# Patient Record
Sex: Female | Born: 1935 | Race: White | Hispanic: No | Marital: Married | Smoking: Never smoker
Health system: Southern US, Community
[De-identification: ages and names within clinical notes are randomized; demographics above are authoritative.]

## PROBLEM LIST (undated history)

## (undated) DIAGNOSIS — Z923 Personal history of irradiation: Secondary | ICD-10-CM

## (undated) DIAGNOSIS — C50919 Malignant neoplasm of unspecified site of unspecified female breast: Secondary | ICD-10-CM

## (undated) DIAGNOSIS — E119 Type 2 diabetes mellitus without complications: Secondary | ICD-10-CM

## (undated) DIAGNOSIS — I10 Essential (primary) hypertension: Secondary | ICD-10-CM

## (undated) HISTORY — PX: BREAST LUMPECTOMY: SHX2

## (undated) HISTORY — DX: Malignant neoplasm of unspecified site of unspecified female breast: C50.919

## (undated) HISTORY — DX: Essential (primary) hypertension: I10

## (undated) HISTORY — PX: CHOLECYSTECTOMY: SHX55

## (undated) HISTORY — PX: TONSILLECTOMY: SUR1361

## (undated) HISTORY — DX: Type 2 diabetes mellitus without complications: E11.9

---

## 1987-05-15 DIAGNOSIS — C50919 Malignant neoplasm of unspecified site of unspecified female breast: Secondary | ICD-10-CM

## 1987-05-15 HISTORY — DX: Malignant neoplasm of unspecified site of unspecified female breast: C50.919

## 1997-12-03 ENCOUNTER — Other Ambulatory Visit: Admission: RE | Admit: 1997-12-03 | Discharge: 1997-12-03 | Payer: Self-pay | Admitting: *Deleted

## 2001-03-11 ENCOUNTER — Encounter: Admission: RE | Admit: 2001-03-11 | Discharge: 2001-03-11 | Payer: Self-pay | Admitting: Oncology

## 2001-03-11 ENCOUNTER — Encounter (HOSPITAL_COMMUNITY): Admission: RE | Admit: 2001-03-11 | Discharge: 2001-04-10 | Payer: Self-pay | Admitting: Oncology

## 2001-03-11 ENCOUNTER — Encounter (HOSPITAL_COMMUNITY): Payer: Self-pay | Admitting: Oncology

## 2001-03-18 ENCOUNTER — Encounter (HOSPITAL_COMMUNITY): Payer: Self-pay | Admitting: Oncology

## 2001-04-08 ENCOUNTER — Encounter (HOSPITAL_COMMUNITY): Payer: Self-pay | Admitting: Oncology

## 2001-04-18 ENCOUNTER — Other Ambulatory Visit: Admission: RE | Admit: 2001-04-18 | Discharge: 2001-04-18 | Payer: Self-pay | Admitting: *Deleted

## 2001-06-20 ENCOUNTER — Ambulatory Visit (HOSPITAL_COMMUNITY): Admission: RE | Admit: 2001-06-20 | Discharge: 2001-06-20 | Payer: Self-pay | Admitting: Internal Medicine

## 2002-03-05 ENCOUNTER — Ambulatory Visit (HOSPITAL_COMMUNITY): Admission: RE | Admit: 2002-03-05 | Discharge: 2002-03-05 | Payer: Self-pay | Admitting: Internal Medicine

## 2002-03-12 ENCOUNTER — Ambulatory Visit (HOSPITAL_COMMUNITY): Admission: RE | Admit: 2002-03-12 | Discharge: 2002-03-12 | Payer: Self-pay | Admitting: Oncology

## 2002-03-12 ENCOUNTER — Encounter (HOSPITAL_COMMUNITY): Payer: Self-pay | Admitting: Oncology

## 2002-04-07 ENCOUNTER — Encounter (HOSPITAL_COMMUNITY): Admission: RE | Admit: 2002-04-07 | Discharge: 2002-05-07 | Payer: Self-pay | Admitting: Oncology

## 2002-04-07 ENCOUNTER — Encounter: Admission: RE | Admit: 2002-04-07 | Discharge: 2002-04-07 | Payer: Self-pay | Admitting: Oncology

## 2002-10-01 ENCOUNTER — Other Ambulatory Visit: Admission: RE | Admit: 2002-10-01 | Discharge: 2002-10-01 | Payer: Self-pay | Admitting: *Deleted

## 2003-05-25 ENCOUNTER — Ambulatory Visit (HOSPITAL_COMMUNITY): Admission: RE | Admit: 2003-05-25 | Discharge: 2003-05-25 | Payer: Self-pay | Admitting: *Deleted

## 2004-03-28 ENCOUNTER — Ambulatory Visit (HOSPITAL_COMMUNITY): Admission: RE | Admit: 2004-03-28 | Discharge: 2004-03-28 | Payer: Self-pay | Admitting: *Deleted

## 2004-07-18 ENCOUNTER — Ambulatory Visit (HOSPITAL_COMMUNITY): Admission: RE | Admit: 2004-07-18 | Discharge: 2004-07-18 | Payer: Self-pay | Admitting: *Deleted

## 2005-05-02 ENCOUNTER — Other Ambulatory Visit: Admission: RE | Admit: 2005-05-02 | Discharge: 2005-05-02 | Payer: Self-pay | Admitting: Dermatology

## 2005-07-03 ENCOUNTER — Ambulatory Visit: Payer: Self-pay | Admitting: Internal Medicine

## 2006-05-15 ENCOUNTER — Ambulatory Visit (HOSPITAL_COMMUNITY): Admission: RE | Admit: 2006-05-15 | Discharge: 2006-05-15 | Payer: Self-pay | Admitting: Family Medicine

## 2007-07-15 ENCOUNTER — Ambulatory Visit: Payer: Self-pay | Admitting: Gastroenterology

## 2007-07-25 ENCOUNTER — Encounter: Payer: Self-pay | Admitting: Internal Medicine

## 2007-07-25 ENCOUNTER — Ambulatory Visit (HOSPITAL_COMMUNITY): Admission: RE | Admit: 2007-07-25 | Discharge: 2007-07-25 | Payer: Self-pay | Admitting: Internal Medicine

## 2007-07-25 ENCOUNTER — Ambulatory Visit: Payer: Self-pay | Admitting: Internal Medicine

## 2008-01-16 ENCOUNTER — Encounter: Admission: RE | Admit: 2008-01-16 | Discharge: 2008-01-16 | Payer: Self-pay | Admitting: Family Medicine

## 2008-09-30 ENCOUNTER — Encounter (INDEPENDENT_AMBULATORY_CARE_PROVIDER_SITE_OTHER): Payer: Self-pay | Admitting: *Deleted

## 2008-11-12 ENCOUNTER — Ambulatory Visit (HOSPITAL_COMMUNITY): Admission: RE | Admit: 2008-11-12 | Discharge: 2008-11-12 | Payer: Self-pay | Admitting: Cardiology

## 2008-11-17 ENCOUNTER — Ambulatory Visit (HOSPITAL_COMMUNITY): Admission: RE | Admit: 2008-11-17 | Discharge: 2008-11-17 | Payer: Self-pay | Admitting: Cardiology

## 2010-08-20 LAB — CBC
HCT: 35.2 % — ABNORMAL LOW (ref 36.0–46.0)
Hemoglobin: 11.8 g/dL — ABNORMAL LOW (ref 12.0–15.0)
MCHC: 33.6 g/dL (ref 30.0–36.0)
Platelets: 195 10*3/uL (ref 150–400)
RBC: 4.27 MIL/uL (ref 3.87–5.11)
RDW: 16.7 % — ABNORMAL HIGH (ref 11.5–15.5)
WBC: 6.9 10*3/uL (ref 4.0–10.5)

## 2010-08-20 LAB — BASIC METABOLIC PANEL
BUN: 9 mg/dL (ref 6–23)
Chloride: 110 mEq/L (ref 96–112)
Creatinine, Ser: 0.76 mg/dL (ref 0.4–1.2)
GFR calc non Af Amer: >60 mL/min (ref >60)
Glucose, Bld: 107 mg/dL — ABNORMAL HIGH (ref 70–99)
Sodium: 142 mEq/L (ref 135–145)

## 2010-09-26 NOTE — Cardiovascular Report (Signed)
NAMEFREEDOM, LOPEZPEREZ NO.:  [id number]   MEDICAL RECORD NO.:  [id number]          PATIENT TYPE:  OIB   LOCATION:  2899                         FACILITY:  [facility]   PHYSICIAN:  Sheliah Mends, MD      DATE OF BIRTH:  09/10/1935   DATE OF PROCEDURE:  11/17/2008  DATE OF DISCHARGE:  11/17/2008                            CARDIAC CATHETERIZATION   INDICATION:  Ms. Hollman is a 75 year old lady who presented with chest  pain and an abnormal myocardial perfusion scan.  She has multiple risk  factors for coronary artery disease and a decision was made to proceed  with cardiac catheterization.   PROCEDURES:  1. Left heart catheterization.  2. Selective coronary angiography.  3. Left ventriculography.   After informed consent was obtained, the patient was brought to the  [facility] Cardiac Catheterization Lab in postabsorptive state.  She was draped in sterile fashion.  Local anesthesia was applied using  lidocaine 1%.  She was started on conscious sedation with Versed and  fentanyl.  A 5-French femoral artery sheath was inserted using the  modified Seldinger technique.  Left heart catheterization was performed  using a 5-French #4 left and right Judkins catheter and left  ventriculography was completed using a 5-French pigtail catheter.   FINDINGS:  Hemodynamics:  Left ventricular pressure 156/10 mmHg, aortic  pressure 152/73 mmHg.   SELECTIVE CORONARY ANGIOGRAPHY:  Left main:  The left main is a very  short vessel that bifurcates almost immediately into the LAD and left  circumflex artery.  The left main has no angiographically significant  disease.   Left anterior descending artery:  The left anterior descending artery is  a large vessel that gives off two diagonal arteries.  The LAD wraps  around the apex.  There was no angiographically significant lesion seen  in the LAD territory.   Left circumflex artery:  The left circumflex is a large vessel that  gives off a branching obtuse marginal.  There is no angiographically  significant disease in the left circumflex artery seen.   Right coronary artery:  The right coronary artery is a medium-sized  vessel that gives off PDA.  The RCA is a dominant vessel.  There is no  significant plaque buildup seen in the RCA territory.   LEFT VENTRICULOGRAPHY:  Left ventriculogram shows normal systolic  function with an estimated ejection fraction of 60%.  There is no  significant mitral regurgitation seen.   SUMMARY:  1. No hemodynamically significant coronary artery disease.  2. Normal left ventricular function.  3. No significant valve disease noted.   PLAN:  Ms. Parada will be continued on medical therapy.  She had  markedly elevated blood pressures throughout the procedure and the focus  will be on blood pressure control.   The procedure was completed without complications.      Sheliah Mends, MD  Electronically Signed     JE/MEDQ  D:  11/17/2008  T:  11/18/2008  Job:  [id number]

## 2010-09-26 NOTE — H&P (Signed)
NAME:  Ashley Huang, Ashley Huang                 ACCOUNT NO.:  [id number]   MEDICAL RECORD NO.:  [id number]          PATIENT TYPE:  AMB   LOCATION:  DAY                           FACILITY:  [facility]   PHYSICIAN:  Kassie Mends, M.D.      DATE OF BIRTH:  [birth year]   DATE OF ADMISSION:  DATE OF DISCHARGE:  LH                              HISTORY & PHYSICAL   CHIEF COMPLAINT:  Refractory gastroesophageal reflux disease/due for  high-risk screening colonoscopy.   HISTORY OF PRESENT ILLNESS:  Ashley Huang is a 75 year old female.  She  has history of chronic GERD.  She has been taking Prevacid 30 mg daily.  She tells me it is not working well for the last 3 months or so.  She is  having nightly regurgitation as well as waterbrash, some heartburn and  indigestion about 3 nights per week.  She denies any anorexia or early  satiety.  Denies any dysphagia or odynophagia.  Denies any fatigue.  She  does admit to taking Metamucil about 30 minutes prior to bedtime.  She  gives history of normal bowel movements once a day.  Denies rectal  bleeding or melena.  She occasionally has a transient left lower  quadrant and right lower quadrant abdominal pain, but otherwise is doing  quite well.   PAST MEDICAL/SURGICAL HISTORY:  She had breast cancer 20 years ago  status post lumpectomy.  She has history of chronic GERD.  Last EGD was  by Dr. Jena Gauss on March 05, 2002 which was normal.  She was on Aciphex  at that time.  She is status post partial hysterectomy, cholecystectomy,  tonsillectomy.  Last colonoscopy was by Dr. Karilyn Cota on June 20, 2001.  She was found to have a few medium scattered diverticula in the sigmoid  colon and small external hemorrhoids.   CURRENT MEDICATIONS:  1. Prevacid 30 mg daily.  2. Metamucil daily.  3. Calcium 500 mg with vitamin D daily.   ALLERGIES:  1. SULFA.  2. SELDANE.  3. RED DYE.   FAMILY HISTORY:  There is no family history of colon carcinoma or other  chronic GI problems.   Mother deceased at 58 secondary to diabetes  mellitus and renal failure.  Father deceased at 24 secondary to prostate  carcinoma.  She has 1 brother with history of diabetes mellitus and 1  with hypertension and MI.  One healthy sister.   SOCIAL HISTORY:  Ashley Huang is married.  She has 2 grown healthy  children.  She is semi-retired from a dental office.  She denies any  tobacco, alcohol, drug use.   REVIEW OF SYSTEMS:  See HPI, otherwise negative.   PHYSICAL EXAMINATION:  VITAL SIGNS:  Weight 173 pounds, height 60  inches, temperature 97.8, blood pressure 138/88 and pulse 80.  GENERAL:  She is a well-developed, well-nourished Caucasian female in no  acute distress.  HEENT:  Sclerae are clear and nonicteric.  Conjunctivae  are pink.  Oropharynx pink and moist without any lesions.  NECK: Supple  without mass or thyromegaly.  CHEST/HEART:  Regular rate and  rhythm,  normal S1 and S2 without murmurs, clicks, rubs or gallops.  LUNGS:  Clear to auscultation bilaterally.  ABDOMEN:  Positive bowel sounds x4.  No bruits auscultated.  Soft, nontender and nondistended without  palpable mass or hepatosplenomegaly.  No guarding.  EXTREMITIES:  Without clubbing or edema bilaterally.  SKIN:  Pink, warm, dry without  any rash or jaundice.   IMPRESSION:  Ashley Huang is a 75 year old female with chronic  gastroesophageal reflux disease, now with refractory symptoms despite  PPI, so she needs further evaluation to rule out complicated  gastroesophageal reflux disease/reflux esophagitis.   She is also due for high-risk screening colonoscopy given a family  history of colon cancer and personal history of breast cancer.   PLAN:  1. Would have her change her Metamucil dosing to a.m. instead of just      prior to bed.  2. Continue Prevacid 30 mg daily, but this may need to be either      increased to b.i.d. or changed to another agent after procedures.  3. High-risk screening colonoscopy and diagnostic  EGD with Dr. Jena Gauss      in the near future.  Discussed the procedure including risks,      benefits including but are not limited to bleeding, infection,      perforation, drug reaction.  She agrees with plan and consent will      be obtained.      Lorenza Burton, N.P.      Kassie Mends, M.D.  Electronically Signed    KJ/MEDQ  D:  07/15/2007  T:  07/15/2007  Job:  [id number]   cc:   Corrie Mckusick, M.D.  Fax: [phone]

## 2010-09-26 NOTE — Op Note (Signed)
NAME:  Ashley Huang, Ashley Huang                 ACCOUNT NO.:  [id number]   MEDICAL RECORD NO.:  [id number]          PATIENT TYPE:  AMB   LOCATION:  DAY                           FACILITY:  [facility]   PHYSICIAN:  R. Roetta Sessions, M.D. DATE OF BIRTH:  November 21, 1935   DATE OF PROCEDURE:  07/25/2007  DATE OF DISCHARGE:                               OPERATIVE REPORT   PROCEDURE:  Diagnostic EGD followed by colonoscopy with biopsy.   INDICATIONS FOR PROCEDURE:  A 75 year old lady with chronic  gastroesophageal reflux disease symptoms now refractory to Prevacid 30  mg orally daily.  We tried her on some Zegerid recently through the  office.  This helped to control reflux symptoms better than Prevacid but  produced diarrhea.  She is not having any odynophagia or dysphagia.  She  has positive family history of colon cancer in a first degree relative  (sister) and a personal history of breast cancer.  She is here for high  risk screening colonoscopy as well as a diagnostic EGD.  This approach  has been discussed with the patient at length.  Potential risks,  benefits and limitations have been reviewed.  Please see documentation  in the medical record.   PROCEDURE IN DETAIL:  O2 saturation, blood pressure, pulse, respirations  were monitored throughout the entire procedure.  Conscious sedation of  Versed 4 mg IV, Demerol 50 mg IV in divided doses.  Instrument Pentax  video chip system.  Cetacaine spray for topical oropharyngeal  anesthesia.   EGD findings, examination of the tubular esophagus revealed a couple of  tiny 1-2 mm distal erosions, otherwise the esophageal mucosa appeared  entirely normal.  EG junction easily traversed, stomach, colon, gastric  cavity was emptied and insufflated well with air.  A thorough  examination of the gastric mucosa including retroflexion of the proximal  stomach, esophagogastric junction demonstrated no abnormalities.  Pylorus was patent, easily traversed.  Examination of  the bulb, second  portion revealed no abnormality.  Therapeutic/diagnostic maneuvers  performed none.   The patient tolerated the procedure well.  She was prepared for  colonoscopy.  Digital rectal exam revealed no abnormalities.  The prep  was good.  The colonic mucosa was surveyed from the rectosigmoid  junction through the left transverse and right colon to the appendiceal  orifice, ileocecal valve and cecum.  Terminal ileal was intubated at 5  cm and from this level the scope was slowly withdrawn.  All previously  mentioned mucosal surfaces were again seen.  The patient had a  diminutive polyp opposite the ileocecal valve which was cold biopsied.  The patient had sigmoid diverticula.  The remainder of the colonic  mucosa and terminal ileum mucosa appeared entirely normal.  The scope  was pulled out of the rectum with thorough examination of the rectal  mucosa including retroflexed view of the anal verge demonstrating no  abnormalities.  The patient tolerated both procedures well and was  reactive after endoscopy.   IMPRESSION:  Tiny distal esophageal erosions consistent with mild  erosive reflux esophagitis, otherwise unremarkable esophagus, normal  stomach, D1, D2.  Colonoscopy findings, normal rectum, sigmoid  diverticula, diminutive polyp opposite the ileocecal valve was status  post cold biopsied, remainder of colonic mucosa and terminal ileum  mucosa appeared normal.   RECOMMENDATIONS:  1. Follow up on path.  2. Stop Prevacid/Zegerid and begin Kapidex 60 mg orally daily for the      next month.  Arrange for her to get free samples through the      office.  She is going to obtain them and let us know how she is      doing in 1 month and we will go from there.      Jonathon Bellows, M.D.  Electronically Signed     RMR/MEDQ  D:  07/25/2007  T:  07/26/2007  Job:  [id number]   cc:   Corrie Mckusick, M.D.  Fax: [phone]

## 2010-09-29 NOTE — H&P (Signed)
NAME:  Ashley Huang, Ashley Huang                             ACCOUNT NO.:  [id number]   MEDICAL RECORD NO.:  [id number]                  PATIENT TYPE:   LOCATION:                                       FACILITY:   PHYSICIAN:  R. Roetta Sessions, M.D.              DATE OF BIRTH:  December 20, 1935   DATE OF ADMISSION:  03/02/2002  DATE OF DISCHARGE:                                HISTORY & PHYSICAL   CHIEF COMPLAINT:  Dark stools and abdominal pain.   HISTORY OF PRESENT ILLNESS:  The patient is a pleasant, 75 year old lady who  presents today with a two-week history of abdominal pain, abdominal bloating  and very dark stools.  She called in on February 26, 2002, with complaints.  It was arranged for her to be seen today.  She says that she treated herself  for sinus allergies with over-the-counter a couple of weeks ago.  She noted  abdominal pain with bloating and very dark stools after beginning this  treatment.  The symptoms lasted for about two weeks.  She also had a two-  week episode of heartburn with no prior history of acid reflux.  Eating  seemed to make all of her symptoms worse.  She took some Pepcid AC which  seemed to help.  Her appetite has been good.  She was taking some Excedrin  during this two-week period of time, but no really no NSAIDs or aspirin  prior to this.  She denies any rectal bleeding.  She denies any constipation  or diarrhea.  She tells me for the last couple of days her symptoms have  totally resolved.  Her stool is now much lighter than it was before.  She  brought in three Hemoccult cards today with one being strongly positive.  The patient states that she has lower abdominal discomfort, but really  denied any epigastric pain.   She underwent colonoscopy in February 2003, for heme positive stools and  intermittent hematochezia.  Examination revealed a few median scattered  diverticula in the sigmoid colon and small external hemorrhoids.   MEDICATIONS:  1. Multivitamin  q.d.  2. Calcium 600 plus D t.i.d.  3. Metamucil q.h.s.   ALLERGIES:  RED DYE, SULFA and SELDANE.   PAST MEDICAL HISTORY:  1. Breast cancer 13 years ago, status post radiation and chemotherapy     therapy with lumpectomy and lymph node removal.  2. Cholecystectomy five years ago.  3. Tonsillectomy.  4. Hysterectomy over 40 years ago.   FAMILY HISTORY:  Mother died of kidney failure at age 11.  She also had  diabetes mellitus.  Father died of prostate cancer.  She has one sister who  is 81 who was diagnosed with colon cancer a couple of months ago.  She could  not tolerate chemotherapy and almost died from it.  She did have some  radiation therapy.   SOCIAL HISTORY:  She  has been married x42 years.  She has two children.  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  She has never  been a smoker.  She denies alcohol use.   REVIEW OF SYMPTOMS:  GASTROINTESTINAL:  Please see HPI.  GENERAL:  Denies  any weight loss.   PHYSICAL EXAMINATION:  VITAL SIGNS:  Weight 170.5, blood pressure 140/70,  pulse 84.  GENERAL:  This is a very pleasant, well-developed, well-nourished, Caucasian  female in no acute distress.  SKIN:  Warm and dry, no juandice.  HEENT:  Conjunctivae are pink.  Sclerae nonicteric.  Oropharyngeal moist and  pink, no lesions, erythema or exudate.  No lymphadenopathy, thyromegaly or  carotid bruits.  CHEST:  Lungs clear to auscultation.  CARDIAC:  Regular rate and rhythm, normal S1, S2, no murmur, rub or gallop.  ABDOMEN:  Positive bowel sounds.  Soft, nontender, nondistended with no  organomegaly or masses.  EXTREMITIES:  No edema.   IMPRESSION:  The patient is a pleasant 75 year old lady who has a history of  Hemoccult positive stools, status post colonoscopy back in February 2003,  with only sigmoid diverticula and small external hemorrhoids.  She presents  today with a two-week history of lower abdominal pain, abdominal bloating  and passing  very dark stools and heartburn symptoms.  She had one strongly  Hemoccult positive stool out of three.  Her symptoms have now resolved.  Given history of repeated Hemoccult positive stool, we feel that we need to  evaluate her upper gastrointestinal tract.  Her recent lower abdominal pain  and bloating could be completely unrelated to her Hemoccult positive stool.  I am glad to see that this is now resolved.  She has had some questionable  melena which would make me be concerned about peptic ulcer disease.   PLAN:  1. EGD in the near future.  2. She will avoid aspirin and NSAIDs for now.  3. Will go ahead and begin Prevacid 30 mg daily, #10 samples given.  4. Further recommendations to follow.     Tana Coast, Pricilla Larsson, M.D.    LL/MEDQ  D:  03/02/2002  T:  03/02/2002  Job:  [id number]

## 2010-09-29 NOTE — Op Note (Signed)
   NAME:  Ashley Huang, Ashley Huang                           ACCOUNT NO.:  [id number]   MEDICAL RECORD NO.:  [id number]                   PATIENT TYPE:  AMB   LOCATION:  DAY                                  FACILITY:  [facility]   PHYSICIAN:  R. Roetta Sessions, M.D.              DATE OF BIRTH:  [birth year]   DATE OF PROCEDURE:  03/05/2002  DATE OF DISCHARGE:                                 OPERATIVE REPORT   PROCEDURE:  Diagnostic esophagogastroduodenoscopy.   INDICATION FOR PROCEDURE:  The patient is a 75 year old lady with long-  standing reflux symptoms.  Had a bout of abdominal pain and black, tarry  stools recently.  Had a colonoscopy in February 2003, which demonstrated  only sigmoid diverticula.  She tells me her abdominal pain and dark stools  have resolved, and she has now been set up for an EGD.  She was started on  Prevacid 30 mg orally daily on 03/02/02.  This approach has been discussed  with the patient previously and again today at the bedside.  Potential  risks, benefits, alternatives have been reviewed and any questions answered,  and she is agreeable.  ASA-2.   DESCRIPTION OF PROCEDURE:  O2 saturation, blood pressure, pulse, and  respirations were monitored throughout the entire procedure.   Conscious sedation:  Versed 3 mg IV, Demerol 50 mg IV in divided doses.   Instrument:  Olympus video chip adult gastroscope.   Findings:  Examination of the tubular esophagus revealed no mucosal  abnormalities.  The EG junction was easily traversed.   Stomach:  The gastric cavity was empty and insufflated well with air.  Very  thorough examination of gastric mucosa, including a retroflex view of the  proximal stomach and esophagogastric junction demonstrated no abnormalities.  The pylorus was patent and easily traversed.   Duodenum:  The bulb and second portion appeared normal.   Therapeutic/Diagnostic maneuvers performed:  None.   The patient tolerated the procedure well, was reactivated  in endoscopy.   IMPRESSION:  1. Normal esophagus.  2. Normal stomach, normal D1, D2.    RECOMMENDATIONS:  1. Continue Aciphex 30 mg orally daily for gastroesophageal reflux disease.  2. She should continue taking Metamucil as a fiber supplement.  3. We will have her come back to see me in the office in four weeks to     assess her progress.                                               Jonathon Bellows, M.D.    RMR/MEDQ  D:  03/05/2002  T:  03/05/2002  Job:  [id number]

## 2011-09-17 ENCOUNTER — Other Ambulatory Visit: Payer: Self-pay | Admitting: Family Medicine

## 2011-09-17 DIAGNOSIS — Z853 Personal history of malignant neoplasm of breast: Secondary | ICD-10-CM

## 2011-09-24 ENCOUNTER — Ambulatory Visit
Admission: RE | Admit: 2011-09-24 | Discharge: 2011-09-24 | Disposition: A | Discharge status: Home / Self Care | Payer: Medicare Other | Source: Ambulatory Visit | Attending: Family Medicine | Admitting: Family Medicine

## 2011-09-24 DIAGNOSIS — Z853 Personal history of malignant neoplasm of breast: Secondary | ICD-10-CM

## 2011-10-23 ENCOUNTER — Other Ambulatory Visit (HOSPITAL_COMMUNITY): Payer: Self-pay | Admitting: Family Medicine

## 2011-10-23 DIAGNOSIS — Z139 Encounter for screening, unspecified: Secondary | ICD-10-CM

## 2011-10-30 ENCOUNTER — Ambulatory Visit (HOSPITAL_COMMUNITY)
Admission: RE | Admit: 2011-10-30 | Discharge: 2011-10-30 | Disposition: A | Discharge status: Home / Self Care | Payer: Medicare Other | Source: Ambulatory Visit | Attending: Family Medicine | Admitting: Family Medicine

## 2011-10-30 ENCOUNTER — Other Ambulatory Visit: Payer: Self-pay | Admitting: Adult Health

## 2011-10-30 ENCOUNTER — Other Ambulatory Visit (HOSPITAL_COMMUNITY)
Admission: RE | Admit: 2011-10-30 | Discharge: 2011-10-30 | Disposition: A | Discharge status: Home / Self Care | Payer: Medicare Other | Source: Ambulatory Visit | Attending: Obstetrics and Gynecology | Admitting: Obstetrics and Gynecology

## 2011-10-30 DIAGNOSIS — Z139 Encounter for screening, unspecified: Secondary | ICD-10-CM

## 2011-10-30 DIAGNOSIS — Z01419 Encounter for gynecological examination (general) (routine) without abnormal findings: Secondary | ICD-10-CM | POA: Insufficient documentation

## 2011-10-30 DIAGNOSIS — Z13820 Encounter for screening for osteoporosis: Secondary | ICD-10-CM | POA: Insufficient documentation

## 2011-11-21 ENCOUNTER — Other Ambulatory Visit: Payer: Self-pay | Admitting: Dermatology

## 2012-06-12 ENCOUNTER — Encounter: Payer: Self-pay | Admitting: Internal Medicine

## 2012-07-09 ENCOUNTER — Ambulatory Visit: Payer: Medicare Other | Admitting: Gastroenterology

## 2012-07-17 ENCOUNTER — Other Ambulatory Visit (INDEPENDENT_AMBULATORY_CARE_PROVIDER_SITE_OTHER): Payer: Self-pay | Admitting: *Deleted

## 2012-07-17 ENCOUNTER — Telehealth (INDEPENDENT_AMBULATORY_CARE_PROVIDER_SITE_OTHER): Payer: Self-pay | Admitting: *Deleted

## 2012-07-17 ENCOUNTER — Encounter (INDEPENDENT_AMBULATORY_CARE_PROVIDER_SITE_OTHER): Payer: Self-pay | Admitting: Internal Medicine

## 2012-07-17 ENCOUNTER — Ambulatory Visit (INDEPENDENT_AMBULATORY_CARE_PROVIDER_SITE_OTHER): Payer: Medicare PPO | Admitting: Internal Medicine

## 2012-07-17 VITALS — BP 132/68 | HR 60 | Ht 60.0 in | Wt 168.8 lb

## 2012-07-17 DIAGNOSIS — Z860100 Personal history of colon polyps, unspecified: Secondary | ICD-10-CM | POA: Insufficient documentation

## 2012-07-17 DIAGNOSIS — Z800 Family history of malignant neoplasm of digestive organs: Secondary | ICD-10-CM

## 2012-07-17 DIAGNOSIS — I10 Essential (primary) hypertension: Secondary | ICD-10-CM | POA: Insufficient documentation

## 2012-07-17 DIAGNOSIS — D126 Benign neoplasm of colon, unspecified: Secondary | ICD-10-CM

## 2012-07-17 MED ORDER — PEG-KCL-NACL-NASULF-NA ASC-C 100 G PO SOLR: 1.0000 | Freq: Once | ORAL | Status: DC

## 2012-07-17 NOTE — Progress Notes (Signed)
Subjective:     Patient ID: Ashley Huang, female   DOB: 1935/07/29, 77 y.o.   MRN: [id number]  HPI Here today requesting a surveillance colonoscopy. Her last colonoscopy was in 2009 by Dr. Jena Gauss.  07/25/2007 Colonoscopy.  Positive family hx of colon cancer in a first degree relative. (sister), and personal hx of breast cancer. Biopsy: Ascending colon polyp: adenomatous polyp. No high grade dysplasia or invasive malignancy.   Appetite is good. No weight loss. No abdominal pain. Occasionally acid reflux and usually controlled with Protonix.. Stools are normal. No change in stool caliber.  Takes Metamucil daily. She does occasionally have bloating No melena or bright red rectal bleeding.  Review of Systems see hpi Current Outpatient Prescriptions  Medication Sig Dispense Refill  . lisinopril (PRINIVIL,ZESTRIL) 40 MG tablet Take 40 mg by mouth daily.      . pantoprazole (PROTONIX) 40 MG tablet Take 40 mg by mouth 2 (two) times daily before a meal.       No current facility-administered medications for this visit.   Past Medical History  Diagnosis Date  . Breast cancer 1989.  Marland Kitchen Hypertension   . DM (diabetes mellitus)     type 2   Past Surgical History  Procedure Laterality Date  . Tonsillectomy    . Cholecystectomy     Allergies  Allergen Reactions  . Antihistamines, Diphenhydramine-Type   . Red Dye   . Sulfa Antibiotics         Objective:   Physical Exam  Filed Vitals:   07/17/12 1109  BP: 132/68  Pulse: 60  Height: 5' (1.524 m)  Weight: 168 lb 12.8 oz (76.567 kg)   Alert and oriented. Skin warm and dry. Oral mucosa is moist.   . Sclera anicteric, conjunctivae is pink. Thyroid not enlarged. No cervical lymphadenopathy. Lungs clear. Heart regular rate and rhythm.  Abdomen is soft. Bowel sounds are positive. No hepatomegaly. No abdominal masses felt. No tenderness.  No edema to lower extremities.       Assessment:    Normal exam. Surveillance colonoscopy for adenoma and  family hx of colon cancer (sister)    Plan:    Surveillance colonoscopy with Dr. Karilyn Cota.The risks and benefits such as perforation, bleeding, and infection were reviewed with the patient and is agreeable.

## 2012-07-17 NOTE — Patient Instructions (Addendum)
Colonoscopy with Dr. Rehman. The risks and benefits such as perforation, bleeding, and infection were reviewed with the patient and is agreeable. 

## 2012-07-17 NOTE — Telephone Encounter (Signed)
Patient needs movi prep 

## 2012-07-21 ENCOUNTER — Encounter (INDEPENDENT_AMBULATORY_CARE_PROVIDER_SITE_OTHER): Payer: Self-pay | Admitting: *Deleted

## 2012-08-01 ENCOUNTER — Encounter (HOSPITAL_COMMUNITY): Payer: Self-pay | Admitting: Pharmacy Technician

## 2012-08-07 MED ORDER — SODIUM CHLORIDE 0.45 % IV SOLN: INTRAVENOUS | Status: DC

## 2012-08-08 ENCOUNTER — Ambulatory Visit (HOSPITAL_COMMUNITY)
Admission: RE | Admit: 2012-08-08 | Discharge: 2012-08-08 | Disposition: A | Discharge status: Home Health | Payer: Medicare PPO | Source: Ambulatory Visit | Attending: Internal Medicine | Admitting: Internal Medicine

## 2012-08-08 ENCOUNTER — Encounter (HOSPITAL_COMMUNITY)
Admission: RE | Disposition: A | Discharge status: Home Health | Payer: Self-pay | Source: Ambulatory Visit | Attending: Internal Medicine

## 2012-08-08 ENCOUNTER — Encounter (HOSPITAL_COMMUNITY): Payer: Self-pay | Admitting: *Deleted

## 2012-08-08 DIAGNOSIS — Z01812 Encounter for preprocedural laboratory examination: Secondary | ICD-10-CM | POA: Insufficient documentation

## 2012-08-08 DIAGNOSIS — Z800 Family history of malignant neoplasm of digestive organs: Secondary | ICD-10-CM

## 2012-08-08 DIAGNOSIS — D126 Benign neoplasm of colon, unspecified: Secondary | ICD-10-CM

## 2012-08-08 DIAGNOSIS — Z860100 Personal history of colon polyps, unspecified: Secondary | ICD-10-CM | POA: Insufficient documentation

## 2012-08-08 DIAGNOSIS — D128 Benign neoplasm of rectum: Secondary | ICD-10-CM | POA: Insufficient documentation

## 2012-08-08 DIAGNOSIS — I10 Essential (primary) hypertension: Secondary | ICD-10-CM | POA: Insufficient documentation

## 2012-08-08 DIAGNOSIS — K5730 Diverticulosis of large intestine without perforation or abscess without bleeding: Secondary | ICD-10-CM | POA: Insufficient documentation

## 2012-08-08 DIAGNOSIS — E119 Type 2 diabetes mellitus without complications: Secondary | ICD-10-CM | POA: Insufficient documentation

## 2012-08-08 DIAGNOSIS — D129 Benign neoplasm of anus and anal canal: Secondary | ICD-10-CM

## 2012-08-08 HISTORY — PX: COLONOSCOPY: SHX5424

## 2012-08-08 SURGERY — COLONOSCOPY
Anesthesia: Moderate Sedation

## 2012-08-08 MED ORDER — MIDAZOLAM HCL 5 MG/5ML IJ SOLN
INTRAMUSCULAR | Status: DC | PRN
  Administered 2012-08-08 (×3): 1 mg via INTRAVENOUS
  Administered 2012-08-08: 2 mg via INTRAVENOUS

## 2012-08-08 MED ORDER — MIDAZOLAM HCL 5 MG/5ML IJ SOLN
INTRAMUSCULAR | Status: AC
  Filled 2012-08-08: qty 10

## 2012-08-08 MED ORDER — MEPERIDINE HCL 50 MG/ML IJ SOLN
INTRAMUSCULAR | Status: AC
  Filled 2012-08-08: qty 1

## 2012-08-08 MED ORDER — MEPERIDINE HCL 50 MG/ML IJ SOLN
INTRAMUSCULAR | Status: DC | PRN
  Administered 2012-08-08 (×2): 25 mg via INTRAVENOUS

## 2012-08-08 MED ORDER — STERILE WATER FOR IRRIGATION IR SOLN
Status: DC | PRN
  Administered 2012-08-08: 09:00:00

## 2012-08-08 MED ORDER — SODIUM CHLORIDE 0.9 % IV SOLN
INTRAVENOUS | Status: DC
  Administered 2012-08-08: 09:00:00 via INTRAVENOUS

## 2012-08-08 NOTE — Op Note (Signed)
COLONOSCOPY PROCEDURE REPORT  PATIENT:  Ashley Huang  MR#:  [id number] Birthdate:  1936/04/08, 77 y.o., female Endoscopist:  Dr. Malissa Hippo, MD Referred By:  Dr. Colette Ribas, MD  Procedure Date: 08/08/2012  Procedure:   Colonoscopy  Indications:  Patient is 77 year old Caucasian female who is history of colonic adenomas and family history of colon carcinoma in a first-degree relative.  Informed Consent:  The procedure and risks were reviewed with the patient and informed consent was obtained.  Medications:  Demerol 50 mg IV Versed 5 mg IV  Description of procedure:  After a digital rectal exam was performed, that colonoscope was advanced from the anus through the rectum and colon to the area of the cecum, ileocecal valve and appendiceal orifice. The cecum was deeply intubated. These structures were well-seen and photographed for the record. From the level of the cecum and ileocecal valve, the scope was slowly and cautiously withdrawn. The mucosal surfaces were carefully surveyed utilizing scope tip to flexion to facilitate fold flattening as needed. The scope was pulled down into the rectum where a thorough exam including retroflexion was performed.  Findings:   Prep excellent. Three small polyps ablated via cold biopsy and submitted together. These are located at ascending colon, sigmoid colon and rectum. Few small diverticula at sigmoid colon. Unremarkable anorectal junction.    Therapeutic/Diagnostic Maneuvers Performed:  See above  Complications:  None  Cecal Withdrawal Time:  13 minutes  Impression:  Examination performed to cecum. Three small polyps ablated via cold biopsy and submitted together as above. Few small diverticula at sigmoid colon.  Recommendations:  Standard instructions given. I will contact patient with biopsy results and further recommendations.   Quill Grinder U  08/08/2012 9:41 AM  CC: Dr. Phillips Odor, Chancy Hurter, MD & Dr. Bonnetta Barry ref. provider  found

## 2012-08-08 NOTE — Discharge Instructions (Signed)
Resume usual medications and high fiber diet. No driving for 24 hours. Physician will contact you with biopsy results.  Colonoscopy Care After Read the instructions outlined below and refer to this sheet in the next few weeks. These discharge instructions provide you with general information on caring for yourself after you leave the hospital. Your doctor may also give you specific instructions. While your treatment has been planned according to the most current medical practices available, unavoidable complications occasionally occur. If you have any problems or questions after discharge, call your doctor. HOME CARE INSTRUCTIONS ACTIVITY:  You may resume your regular activity, but move at a slower pace for the next 24 hours.  Take frequent rest periods for the next 24 hours.  Walking will help get rid of the air and reduce the bloated feeling in your belly (abdomen).  No driving for 24 hours (because of the medicine (anesthesia) used during the test).  You may shower.  Do not sign any important legal documents or operate any machinery for 24 hours (because of the anesthesia used during the test). NUTRITION:  Drink plenty of fluids.  You may resume your normal diet as instructed by your doctor.  Begin with a light meal and progress to your normal diet. Heavy or fried foods are harder to digest and may make you feel sick to your stomach (nauseated).  Avoid alcoholic beverages for 24 hours or as instructed. MEDICATIONS:  You may resume your normal medications unless your doctor tells you otherwise. WHAT TO EXPECT TODAY:  Some feelings of bloating in the abdomen.  Passage of more gas than usual.  Spotting of blood in your stool or on the toilet paper. IF YOU HAD POLYPS REMOVED DURING THE COLONOSCOPY:  No aspirin products for 7 days or as instructed.  No alcohol for 7 days or as instructed.  Eat a soft diet for the next 24 hours. FINDING OUT THE RESULTS OF YOUR TEST Not  all test results are available during your visit. If your test results are not back during the visit, make an appointment with your caregiver to find out the results. Do not assume everything is normal if you have not heard from your caregiver or the medical facility. It is important for you to follow up on all of your test results.  SEEK IMMEDIATE MEDICAL CARE IF:  You have more than a spotting of blood in your stool.  Your belly is swollen (abdominal distention).  You are nauseated or vomiting.  You have a fever.  You have abdominal pain or discomfort that is severe or gets worse throughout the day.  Colon Polyps Polyps are lumps of extra tissue growing inside the body. Polyps can grow in the large intestine (colon). Most colon polyps are noncancerous (benign). However, some colon polyps can become cancerous over time. Polyps that are larger than a pea may be harmful. To be safe, caregivers remove and test all polyps. CAUSES  Polyps form when mutations in the genes cause your cells to grow and divide even though no more tissue is needed. RISK FACTORS There are a number of risk factors that can increase your chances of getting colon polyps. They include:  Being older than 50 years.  Family history of colon polyps or colon cancer.  Long-term colon diseases, such as colitis or Crohn disease.  Being overweight.  Smoking.  Being inactive.  Drinking too much alcohol. SYMPTOMS  Most small polyps do not cause symptoms. If symptoms are present, they may include:  Blood in the stool. The stool may look dark red or black.  Constipation or diarrhea that lasts longer than 1 week. DIAGNOSIS People often do not know they have polyps until their caregiver finds them during a regular checkup. Your caregiver can use 4 tests to check for polyps:  Digital rectal exam. The caregiver wears gloves and feels inside the rectum. This test would find polyps only in the rectum.  Barium enema. The  caregiver puts a liquid called barium into your rectum before taking X-rays of your colon. Barium makes your colon look white. Polyps are dark, so they are easy to see in the X-ray pictures.  Sigmoidoscopy. A thin, flexible tube (sigmoidoscope) is placed into your rectum. The sigmoidoscope has a light and tiny camera in it. The caregiver uses the sigmoidoscope to look at the last third of your colon.  Colonoscopy. This test is like sigmoidoscopy, but the caregiver looks at the entire colon. This is the most common method for finding and removing polyps. TREATMENT  Any polyps will be removed during a sigmoidoscopy or colonoscopy. The polyps are then tested for cancer. PREVENTION  To help lower your risk of getting more colon polyps:  Eat plenty of fruits and vegetables. Avoid eating fatty foods.  Do not smoke.  Avoid drinking alcohol.  Exercise every day.  Lose weight if recommended by your caregiver.  Eat plenty of calcium and folate. Foods that are rich in calcium include milk, cheese, and broccoli. Foods that are rich in folate include chickpeas, kidney beans, and spinach. HOME CARE INSTRUCTIONS Keep all follow-up appointments as directed by your caregiver. You may need periodic exams to check for polyps. SEEK MEDICAL CARE IF: You notice bleeding during a bowel movement.  Diverticulosis Diverticulosis is a common condition that develops when small pouches (diverticula) form in the wall of the colon. The risk of diverticulosis increases with age. It happens more often in people who eat a low-fiber diet. Most individuals with diverticulosis have no symptoms. Those individuals with symptoms usually experience abdominal pain, constipation, or loose stools (diarrhea). HOME CARE INSTRUCTIONS   Increase the amount of fiber in your diet as directed by your caregiver or dietician. This may reduce symptoms of diverticulosis.  Your caregiver may recommend taking a dietary fiber  supplement.  Drink at least 6 to 8 glasses of water each day to prevent constipation.  Try not to strain when you have a bowel movement.  Your caregiver may recommend avoiding nuts and seeds to prevent complications, although this is still an uncertain benefit.  Only take over-the-counter or prescription medicines for pain, discomfort, or fever as directed by your caregiver. FOODS WITH HIGH FIBER CONTENT INCLUDE:  Fruits. Apple, peach, pear, tangerine, raisins, prunes.  Vegetables. Brussels sprouts, asparagus, broccoli, cabbage, carrot, cauliflower, romaine lettuce, spinach, summer squash, tomato, winter squash, zucchini.  Starchy Vegetables. Baked beans, kidney beans, lima beans, split peas, lentils, potatoes (with skin).  Grains. Whole wheat bread, brown rice, bran flake cereal, plain oatmeal, white rice, shredded wheat, bran muffins. SEEK IMMEDIATE MEDICAL CARE IF:   You develop increasing pain or severe bloating.  You have an oral temperature above 102 F (38.9 C), not controlled by medicine.  You develop vomiting or bowel movements that are bloody or black. High-Fiber Diet Fiber is found in fruits, vegetables, and grains. A high-fiber diet encourages the addition of more whole grains, legumes, fruits, and vegetables in your diet. The recommended amount of fiber for adult males is 38 g per day.  For adult females, it is 25 g per day. Pregnant and lactating women should get 28 g of fiber per day. If you have a digestive or bowel problem, ask your caregiver for advice before adding high-fiber foods to your diet. Eat a variety of high-fiber foods instead of only a select few type of foods.  PURPOSE  To increase stool bulk.  To make bowel movements more regular to prevent constipation.  To lower cholesterol.  To prevent overeating. WHEN IS THIS DIET USED?  It may be used if you have constipation and hemorrhoids.  It may be used if you have uncomplicated diverticulosis  (intestine condition) and irritable bowel syndrome.  It may be used if you need help with weight management.  It may be used if you want to add it to your diet as a protective measure against atherosclerosis, diabetes, and cancer. SOURCES OF FIBER  Whole-grain breads and cereals.  Fruits, such as apples, oranges, bananas, berries, prunes, and pears.  Vegetables, such as green peas, carrots, sweet potatoes, beets, broccoli, cabbage, spinach, and artichokes.  Legumes, such split peas, soy, lentils.  Almonds. FIBER CONTENT IN FOODS Starches and Grains / Dietary Fiber (g)  Cheerios, 1 cup / 3 g  Corn Flakes cereal, 1 cup / 0.7 g  Rice crispy treat cereal, 1 cup / 0.3 g  Instant oatmeal (cooked),  cup / 2 g  Frosted wheat cereal, 1 cup / 5.1 g  Brown, long-grain rice (cooked), 1 cup / 3.5 g  White, long-grain rice (cooked), 1 cup / 0.6 g  Enriched macaroni (cooked), 1 cup / 2.5 g Legumes / Dietary Fiber (g)  Baked beans (canned, plain, or vegetarian),  cup / 5.2 g  Kidney beans (canned),  cup / 6.8 g  Pinto beans (cooked),  cup / 5.5 g Breads and Crackers / Dietary Fiber (g)  Plain or honey graham crackers, 2 squares / 0.7 g  Saltine crackers, 3 squares / 0.3 g  Plain, salted pretzels, 10 pieces / 1.8 g  Whole-wheat bread, 1 slice / 1.9 g  White bread, 1 slice / 0.7 g  Raisin bread, 1 slice / 1.2 g  Plain bagel, 3 oz / 2 g  Flour tortilla, 1 oz / 0.9 g  Corn tortilla, 1 small / 1.5 g  Hamburger or hotdog bun, 1 small / 0.9 g Fruits / Dietary Fiber (g)  Apple with skin, 1 medium / 4.4 g  Sweetened applesauce,  cup / 1.5 g  Banana,  medium / 1.5 g  Grapes, 10 grapes / 0.4 g  Orange, 1 small / 2.3 g  Raisin, 1.5 oz / 1.6 g  Melon, 1 cup / 1.4 g Vegetables / Dietary Fiber (g)  Green beans (canned),  cup / 1.3 g  Carrots (cooked),  cup / 2.3 g  Broccoli (cooked),  cup / 2.8 g  Peas (cooked),  cup / 4.4 g  Mashed potatoes,  cup /  1.6 g  Lettuce, 1 cup / 0.5 g  Corn (canned),  cup / 1.6 g  Tomato,  cup / 1.1 g Document Released: 04/30/2005 Document Revised: 10/30/2011 Document Reviewed: 08/02/2011 [facility] Patient Information 2013 [place], [place].  Marland Kitchen

## 2012-08-08 NOTE — H&P (Signed)
Ashley Huang is an 77 y.o. female.   Chief Complaint: Patient is here for colonoscopy. HPI: Patient is 77 year old Caucasian female with history of colonic polyps and is here for an examination. She denies abdominal pain change in bowel habits or rectal bleeding. Family history is also significant for colon carcinoma in her sister at age 37 and she died 10 years later metastatic disease. Patient's last colonoscopy was 5 years ago.  Past Medical History  Diagnosis Date  . Breast cancer 1989.  Marland Kitchen Hypertension   . DM (diabetes mellitus)     type 2-diet controlled    Past Surgical History  Procedure Laterality Date  . Tonsillectomy    . Cholecystectomy      Family History  Problem Relation Age of Onset  . Colon cancer Sister    Social History:  reports that she has never smoked. She does not have any smokeless tobacco history on file. She reports that she does not drink alcohol or use illicit drugs.  Allergies:  Allergies  Allergen Reactions  . Antihistamines, Diphenhydramine-Type Anaphylaxis and Swelling  . Red Dye Rash  . Sulfa Antibiotics Itching and Rash    Medications Prior to Admission  Medication Sig Dispense Refill  . mometasone (NASONEX) 50 MCG/ACT nasal spray Place 2 sprays into the nose daily.      . montelukast (SINGULAIR) 10 MG tablet Take 10 mg by mouth as needed (Allergies).       . peg 3350 powder (MOVIPREP) 100 G SOLR Take 1 kit (100 g total) by mouth once.  1 kit  0  . calcium carbonate (OS-CAL) 600 MG TABS Take 600 mg by mouth daily.      . cholecalciferol (VITAMIN D) 1000 UNITS tablet Take 1,000 Units by mouth daily.      . fish oil-omega-3 fatty acids 1000 MG capsule Take 1 g by mouth daily.       Marland Kitchen lisinopril (PRINIVIL,ZESTRIL) 40 MG tablet Take 40 mg by mouth daily.      . pantoprazole (PROTONIX) 40 MG tablet Take 40 mg by mouth 2 (two) times daily before a meal.      . psyllium (HYDROCIL/METAMUCIL) 95 % PACK Take 1 packet by mouth at bedtime.         Results for orders placed during the hospital encounter of 08/08/12 (from the past 48 hour(s))  GLUCOSE, CAPILLARY     Status: Abnormal   Collection Time    08/08/12  8:41 AM      Result Value Range   Glucose-Capillary 122 (*) 70 - 99 mg/dL   No results found.  ROS  Blood pressure 166/79, pulse 86, temperature 97.7 F (36.5 C), temperature source Oral, resp. rate 22, height 5' (1.524 m), weight 168 lb (76.204 kg), SpO2 94.00%. Physical Exam  Constitutional: She appears well-developed and well-nourished.  HENT:  Mouth/Throat: Oropharynx is clear and moist.  Eyes: Conjunctivae are normal. No scleral icterus.  Neck: No thyromegaly present.  Cardiovascular: Normal rate and regular rhythm.   No murmur heard. Respiratory: Effort normal and breath sounds normal.  GI: Soft. She exhibits no distension and no mass. There is no tenderness.  Musculoskeletal: She exhibits no edema.  Lymphadenopathy:    She has no cervical adenopathy.  Neurological: She is alert.  Skin: Skin is warm and dry.     Assessment/Plan History of colonic polyps. Family history of colon carcinoma in first degree relative. Surveillance colonoscopy.  REHMAN,NAJEEB U 08/08/2012, 9:01 AM

## 2012-08-12 ENCOUNTER — Encounter (HOSPITAL_COMMUNITY): Payer: Self-pay | Admitting: Internal Medicine

## 2012-08-18 ENCOUNTER — Encounter (INDEPENDENT_AMBULATORY_CARE_PROVIDER_SITE_OTHER): Payer: Self-pay | Admitting: *Deleted

## 2012-11-10 ENCOUNTER — Telehealth (INDEPENDENT_AMBULATORY_CARE_PROVIDER_SITE_OTHER): Payer: Self-pay | Admitting: *Deleted

## 2012-11-10 ENCOUNTER — Other Ambulatory Visit (INDEPENDENT_AMBULATORY_CARE_PROVIDER_SITE_OTHER): Payer: Self-pay | Admitting: Internal Medicine

## 2012-11-10 DIAGNOSIS — K219 Gastro-esophageal reflux disease without esophagitis: Secondary | ICD-10-CM

## 2012-11-10 MED ORDER — OMEPRAZOLE 20 MG PO CPDR: 20.0000 mg | DELAYED_RELEASE_CAPSULE | Freq: Two times a day (BID) | ORAL | Status: DC

## 2012-11-10 NOTE — Telephone Encounter (Signed)
This has already been addressed

## 2012-11-10 NOTE — Telephone Encounter (Signed)
The pharmacy can not get the Pantoprazole in and other pharmacies are out also. Not sue when they can get this drug in. She has been trying Nexium like her pharmacist suggested. She is staying bloated with diarrhea. Would like to know if Camelia Eng can call her something else in to [facility] [place]. Her return phone number is [phone].

## 2013-06-04 ENCOUNTER — Other Ambulatory Visit (HOSPITAL_COMMUNITY): Payer: Self-pay | Admitting: Family Medicine

## 2013-06-04 ENCOUNTER — Ambulatory Visit (HOSPITAL_COMMUNITY)
Admission: RE | Admit: 2013-06-04 | Discharge: 2013-06-04 | Disposition: A | Discharge status: Home / Self Care | Payer: Medicare HMO | Source: Ambulatory Visit | Attending: Family Medicine | Admitting: Family Medicine

## 2013-06-04 DIAGNOSIS — L0390 Cellulitis, unspecified: Secondary | ICD-10-CM

## 2013-06-04 DIAGNOSIS — H0489 Other disorders of lacrimal system: Secondary | ICD-10-CM | POA: Insufficient documentation

## 2013-06-04 DIAGNOSIS — L0291 Cutaneous abscess, unspecified: Secondary | ICD-10-CM | POA: Insufficient documentation

## 2013-06-04 MED ORDER — IOHEXOL 300 MG/ML  SOLN
75.0000 mL | Freq: Once | INTRAMUSCULAR | Status: AC | PRN
  Administered 2013-06-04: 75 mL via INTRAVENOUS

## 2014-02-10 ENCOUNTER — Other Ambulatory Visit (HOSPITAL_COMMUNITY): Payer: Self-pay | Admitting: Family Medicine

## 2014-02-10 DIAGNOSIS — Z139 Encounter for screening, unspecified: Secondary | ICD-10-CM

## 2014-02-10 DIAGNOSIS — Z13820 Encounter for screening for osteoporosis: Secondary | ICD-10-CM

## 2014-02-12 ENCOUNTER — Ambulatory Visit (HOSPITAL_COMMUNITY)
Admission: RE | Admit: 2014-02-12 | Discharge: 2014-02-12 | Disposition: A | Discharge status: Home / Self Care | Payer: Medicare HMO | Source: Ambulatory Visit | Attending: Family Medicine | Admitting: Family Medicine

## 2014-02-12 DIAGNOSIS — Z780 Asymptomatic menopausal state: Secondary | ICD-10-CM | POA: Diagnosis not present

## 2014-02-12 DIAGNOSIS — Z13820 Encounter for screening for osteoporosis: Secondary | ICD-10-CM | POA: Insufficient documentation

## 2014-02-12 DIAGNOSIS — M810 Age-related osteoporosis without current pathological fracture: Secondary | ICD-10-CM | POA: Diagnosis not present

## 2014-02-12 DIAGNOSIS — M8580 Other specified disorders of bone density and structure, unspecified site: Secondary | ICD-10-CM | POA: Insufficient documentation

## 2014-03-05 ENCOUNTER — Other Ambulatory Visit: Payer: Self-pay

## 2014-03-05 DIAGNOSIS — Z1239 Encounter for other screening for malignant neoplasm of breast: Secondary | ICD-10-CM

## 2014-03-05 DIAGNOSIS — Z9889 Other specified postprocedural states: Secondary | ICD-10-CM

## 2014-03-05 DIAGNOSIS — Z853 Personal history of malignant neoplasm of breast: Secondary | ICD-10-CM

## 2014-03-22 ENCOUNTER — Encounter (INDEPENDENT_AMBULATORY_CARE_PROVIDER_SITE_OTHER): Payer: Self-pay

## 2014-03-22 ENCOUNTER — Other Ambulatory Visit: Payer: Self-pay

## 2014-03-22 ENCOUNTER — Ambulatory Visit
Admission: RE | Admit: 2014-03-22 | Discharge: 2014-03-22 | Disposition: A | Discharge status: Home / Self Care | Payer: Medicare HMO | Source: Ambulatory Visit

## 2014-03-22 DIAGNOSIS — Z1239 Encounter for other screening for malignant neoplasm of breast: Secondary | ICD-10-CM

## 2014-03-22 DIAGNOSIS — Z853 Personal history of malignant neoplasm of breast: Secondary | ICD-10-CM

## 2014-03-22 DIAGNOSIS — Z9889 Other specified postprocedural states: Secondary | ICD-10-CM

## 2014-03-22 DIAGNOSIS — Z1231 Encounter for screening mammogram for malignant neoplasm of breast: Secondary | ICD-10-CM

## 2014-05-26 ENCOUNTER — Other Ambulatory Visit: Payer: Self-pay | Admitting: Dermatology

## 2015-06-02 DIAGNOSIS — Z86018 Personal history of other benign neoplasm: Secondary | ICD-10-CM | POA: Diagnosis not present

## 2015-06-02 DIAGNOSIS — Z85828 Personal history of other malignant neoplasm of skin: Secondary | ICD-10-CM | POA: Diagnosis not present

## 2015-06-02 DIAGNOSIS — D2261 Melanocytic nevi of right upper limb, including shoulder: Secondary | ICD-10-CM | POA: Diagnosis not present

## 2015-06-02 DIAGNOSIS — Z23 Encounter for immunization: Secondary | ICD-10-CM | POA: Diagnosis not present

## 2015-06-02 DIAGNOSIS — L821 Other seborrheic keratosis: Secondary | ICD-10-CM | POA: Diagnosis not present

## 2015-06-16 DIAGNOSIS — Z6827 Body mass index (BMI) 27.0-27.9, adult: Secondary | ICD-10-CM | POA: Diagnosis not present

## 2015-06-16 DIAGNOSIS — Z1389 Encounter for screening for other disorder: Secondary | ICD-10-CM | POA: Diagnosis not present

## 2015-06-16 DIAGNOSIS — J069 Acute upper respiratory infection, unspecified: Secondary | ICD-10-CM | POA: Diagnosis not present

## 2015-06-16 DIAGNOSIS — H6593 Unspecified nonsuppurative otitis media, bilateral: Secondary | ICD-10-CM | POA: Diagnosis not present

## 2015-06-16 DIAGNOSIS — E663 Overweight: Secondary | ICD-10-CM | POA: Diagnosis not present

## 2015-06-17 DIAGNOSIS — H02834 Dermatochalasis of left upper eyelid: Secondary | ICD-10-CM | POA: Diagnosis not present

## 2015-06-17 DIAGNOSIS — H02831 Dermatochalasis of right upper eyelid: Secondary | ICD-10-CM | POA: Diagnosis not present

## 2015-06-17 DIAGNOSIS — H2513 Age-related nuclear cataract, bilateral: Secondary | ICD-10-CM | POA: Diagnosis not present

## 2015-06-17 DIAGNOSIS — E119 Type 2 diabetes mellitus without complications: Secondary | ICD-10-CM | POA: Diagnosis not present

## 2015-06-20 DIAGNOSIS — J209 Acute bronchitis, unspecified: Secondary | ICD-10-CM | POA: Diagnosis not present

## 2015-06-20 DIAGNOSIS — E663 Overweight: Secondary | ICD-10-CM | POA: Diagnosis not present

## 2015-06-20 DIAGNOSIS — Z6827 Body mass index (BMI) 27.0-27.9, adult: Secondary | ICD-10-CM | POA: Diagnosis not present

## 2015-06-30 DIAGNOSIS — E663 Overweight: Secondary | ICD-10-CM | POA: Diagnosis not present

## 2015-06-30 DIAGNOSIS — Z6827 Body mass index (BMI) 27.0-27.9, adult: Secondary | ICD-10-CM | POA: Diagnosis not present

## 2015-06-30 DIAGNOSIS — J302 Other seasonal allergic rhinitis: Secondary | ICD-10-CM | POA: Diagnosis not present

## 2015-06-30 DIAGNOSIS — Z1389 Encounter for screening for other disorder: Secondary | ICD-10-CM | POA: Diagnosis not present

## 2015-06-30 DIAGNOSIS — H6503 Acute serous otitis media, bilateral: Secondary | ICD-10-CM | POA: Diagnosis not present

## 2015-07-22 DIAGNOSIS — Z6827 Body mass index (BMI) 27.0-27.9, adult: Secondary | ICD-10-CM | POA: Diagnosis not present

## 2015-07-22 DIAGNOSIS — E782 Mixed hyperlipidemia: Secondary | ICD-10-CM | POA: Diagnosis not present

## 2015-07-22 DIAGNOSIS — I10 Essential (primary) hypertension: Secondary | ICD-10-CM | POA: Diagnosis not present

## 2015-07-22 DIAGNOSIS — E663 Overweight: Secondary | ICD-10-CM | POA: Diagnosis not present

## 2015-07-22 DIAGNOSIS — Z1389 Encounter for screening for other disorder: Secondary | ICD-10-CM | POA: Diagnosis not present

## 2015-07-22 DIAGNOSIS — E119 Type 2 diabetes mellitus without complications: Secondary | ICD-10-CM | POA: Diagnosis not present

## 2015-08-08 DIAGNOSIS — H2512 Age-related nuclear cataract, left eye: Secondary | ICD-10-CM | POA: Diagnosis not present

## 2015-08-16 DIAGNOSIS — H2511 Age-related nuclear cataract, right eye: Secondary | ICD-10-CM | POA: Diagnosis not present

## 2015-08-22 DIAGNOSIS — H2511 Age-related nuclear cataract, right eye: Secondary | ICD-10-CM | POA: Diagnosis not present

## 2015-11-01 DIAGNOSIS — E782 Mixed hyperlipidemia: Secondary | ICD-10-CM | POA: Diagnosis not present

## 2015-11-01 DIAGNOSIS — I10 Essential (primary) hypertension: Secondary | ICD-10-CM | POA: Diagnosis not present

## 2015-11-01 DIAGNOSIS — Z6826 Body mass index (BMI) 26.0-26.9, adult: Secondary | ICD-10-CM | POA: Diagnosis not present

## 2015-11-01 DIAGNOSIS — E119 Type 2 diabetes mellitus without complications: Secondary | ICD-10-CM | POA: Diagnosis not present

## 2015-11-01 DIAGNOSIS — Z1389 Encounter for screening for other disorder: Secondary | ICD-10-CM | POA: Diagnosis not present

## 2016-02-01 DIAGNOSIS — Z6826 Body mass index (BMI) 26.0-26.9, adult: Secondary | ICD-10-CM | POA: Diagnosis not present

## 2016-02-01 DIAGNOSIS — E663 Overweight: Secondary | ICD-10-CM | POA: Diagnosis not present

## 2016-02-01 DIAGNOSIS — E119 Type 2 diabetes mellitus without complications: Secondary | ICD-10-CM | POA: Diagnosis not present

## 2016-02-22 ENCOUNTER — Other Ambulatory Visit: Payer: Self-pay | Admitting: Family Medicine

## 2016-02-22 DIAGNOSIS — Z1231 Encounter for screening mammogram for malignant neoplasm of breast: Secondary | ICD-10-CM

## 2016-03-13 ENCOUNTER — Ambulatory Visit
Admission: RE | Admit: 2016-03-13 | Discharge: 2016-03-13 | Disposition: A | Discharge status: Home / Self Care | Payer: PPO | Source: Ambulatory Visit | Attending: Family Medicine | Admitting: Family Medicine

## 2016-03-13 DIAGNOSIS — Z1231 Encounter for screening mammogram for malignant neoplasm of breast: Secondary | ICD-10-CM

## 2016-03-14 DIAGNOSIS — Z23 Encounter for immunization: Secondary | ICD-10-CM | POA: Diagnosis not present

## 2016-06-07 DIAGNOSIS — D2261 Melanocytic nevi of right upper limb, including shoulder: Secondary | ICD-10-CM | POA: Diagnosis not present

## 2016-06-07 DIAGNOSIS — Z85828 Personal history of other malignant neoplasm of skin: Secondary | ICD-10-CM | POA: Diagnosis not present

## 2016-06-07 DIAGNOSIS — L821 Other seborrheic keratosis: Secondary | ICD-10-CM | POA: Diagnosis not present

## 2016-06-07 DIAGNOSIS — D485 Neoplasm of uncertain behavior of skin: Secondary | ICD-10-CM | POA: Diagnosis not present

## 2016-06-07 DIAGNOSIS — L570 Actinic keratosis: Secondary | ICD-10-CM | POA: Diagnosis not present

## 2016-06-07 DIAGNOSIS — D2272 Melanocytic nevi of left lower limb, including hip: Secondary | ICD-10-CM | POA: Diagnosis not present

## 2016-06-07 DIAGNOSIS — Z86018 Personal history of other benign neoplasm: Secondary | ICD-10-CM | POA: Diagnosis not present

## 2016-06-07 DIAGNOSIS — Z23 Encounter for immunization: Secondary | ICD-10-CM | POA: Diagnosis not present

## 2016-06-07 DIAGNOSIS — D0461 Carcinoma in situ of skin of right upper limb, including shoulder: Secondary | ICD-10-CM | POA: Diagnosis not present

## 2016-06-27 DIAGNOSIS — H04123 Dry eye syndrome of bilateral lacrimal glands: Secondary | ICD-10-CM | POA: Diagnosis not present

## 2016-06-27 DIAGNOSIS — E119 Type 2 diabetes mellitus without complications: Secondary | ICD-10-CM | POA: Diagnosis not present

## 2016-06-27 DIAGNOSIS — Z961 Presence of intraocular lens: Secondary | ICD-10-CM | POA: Diagnosis not present

## 2016-06-27 DIAGNOSIS — H1851 Endothelial corneal dystrophy: Secondary | ICD-10-CM | POA: Diagnosis not present

## 2016-06-27 DIAGNOSIS — H43393 Other vitreous opacities, bilateral: Secondary | ICD-10-CM | POA: Diagnosis not present

## 2016-06-29 DIAGNOSIS — D0461 Carcinoma in situ of skin of right upper limb, including shoulder: Secondary | ICD-10-CM | POA: Diagnosis not present

## 2016-08-01 DIAGNOSIS — Z6827 Body mass index (BMI) 27.0-27.9, adult: Secondary | ICD-10-CM | POA: Diagnosis not present

## 2016-08-01 DIAGNOSIS — E782 Mixed hyperlipidemia: Secondary | ICD-10-CM | POA: Diagnosis not present

## 2016-08-01 DIAGNOSIS — I10 Essential (primary) hypertension: Secondary | ICD-10-CM | POA: Diagnosis not present

## 2016-08-01 DIAGNOSIS — J309 Allergic rhinitis, unspecified: Secondary | ICD-10-CM | POA: Diagnosis not present

## 2016-08-01 DIAGNOSIS — E559 Vitamin D deficiency, unspecified: Secondary | ICD-10-CM | POA: Diagnosis not present

## 2016-08-01 DIAGNOSIS — E663 Overweight: Secondary | ICD-10-CM | POA: Diagnosis not present

## 2016-08-01 DIAGNOSIS — E119 Type 2 diabetes mellitus without complications: Secondary | ICD-10-CM | POA: Diagnosis not present

## 2016-08-01 DIAGNOSIS — Z1389 Encounter for screening for other disorder: Secondary | ICD-10-CM | POA: Diagnosis not present

## 2016-10-25 DIAGNOSIS — H6503 Acute serous otitis media, bilateral: Secondary | ICD-10-CM | POA: Diagnosis not present

## 2016-10-25 DIAGNOSIS — Z6826 Body mass index (BMI) 26.0-26.9, adult: Secondary | ICD-10-CM | POA: Diagnosis not present

## 2016-10-25 DIAGNOSIS — J069 Acute upper respiratory infection, unspecified: Secondary | ICD-10-CM | POA: Diagnosis not present

## 2016-10-25 DIAGNOSIS — E663 Overweight: Secondary | ICD-10-CM | POA: Diagnosis not present

## 2017-01-17 DIAGNOSIS — Z86018 Personal history of other benign neoplasm: Secondary | ICD-10-CM | POA: Diagnosis not present

## 2017-01-17 DIAGNOSIS — D485 Neoplasm of uncertain behavior of skin: Secondary | ICD-10-CM | POA: Diagnosis not present

## 2017-01-17 DIAGNOSIS — Z85828 Personal history of other malignant neoplasm of skin: Secondary | ICD-10-CM | POA: Diagnosis not present

## 2017-01-17 DIAGNOSIS — D2261 Melanocytic nevi of right upper limb, including shoulder: Secondary | ICD-10-CM | POA: Diagnosis not present

## 2017-01-17 DIAGNOSIS — C44722 Squamous cell carcinoma of skin of right lower limb, including hip: Secondary | ICD-10-CM | POA: Diagnosis not present

## 2017-01-17 DIAGNOSIS — D2272 Melanocytic nevi of left lower limb, including hip: Secondary | ICD-10-CM | POA: Diagnosis not present

## 2017-01-17 DIAGNOSIS — Z23 Encounter for immunization: Secondary | ICD-10-CM | POA: Diagnosis not present

## 2017-01-17 DIAGNOSIS — L821 Other seborrheic keratosis: Secondary | ICD-10-CM | POA: Diagnosis not present

## 2017-01-17 DIAGNOSIS — D2230 Melanocytic nevi of unspecified part of face: Secondary | ICD-10-CM | POA: Diagnosis not present

## 2017-02-07 ENCOUNTER — Other Ambulatory Visit (HOSPITAL_COMMUNITY): Payer: Self-pay | Admitting: Family Medicine

## 2017-02-07 DIAGNOSIS — E663 Overweight: Secondary | ICD-10-CM | POA: Diagnosis not present

## 2017-02-07 DIAGNOSIS — Z6826 Body mass index (BMI) 26.0-26.9, adult: Secondary | ICD-10-CM | POA: Diagnosis not present

## 2017-02-07 DIAGNOSIS — E2839 Other primary ovarian failure: Secondary | ICD-10-CM

## 2017-02-07 DIAGNOSIS — E782 Mixed hyperlipidemia: Secondary | ICD-10-CM | POA: Diagnosis not present

## 2017-02-07 DIAGNOSIS — E119 Type 2 diabetes mellitus without complications: Secondary | ICD-10-CM | POA: Diagnosis not present

## 2017-02-07 DIAGNOSIS — Z1389 Encounter for screening for other disorder: Secondary | ICD-10-CM | POA: Diagnosis not present

## 2017-02-07 DIAGNOSIS — I10 Essential (primary) hypertension: Secondary | ICD-10-CM | POA: Diagnosis not present

## 2017-02-08 ENCOUNTER — Other Ambulatory Visit (HOSPITAL_COMMUNITY): Payer: PPO

## 2017-02-14 ENCOUNTER — Ambulatory Visit (HOSPITAL_COMMUNITY)
Admission: RE | Admit: 2017-02-14 | Discharge: 2017-02-14 | Disposition: A | Discharge status: Home / Self Care | Payer: PPO | Source: Ambulatory Visit | Attending: Family Medicine | Admitting: Family Medicine

## 2017-02-14 DIAGNOSIS — M8589 Other specified disorders of bone density and structure, multiple sites: Secondary | ICD-10-CM | POA: Insufficient documentation

## 2017-02-14 DIAGNOSIS — Z6826 Body mass index (BMI) 26.0-26.9, adult: Secondary | ICD-10-CM | POA: Insufficient documentation

## 2017-02-14 DIAGNOSIS — Z780 Asymptomatic menopausal state: Secondary | ICD-10-CM | POA: Diagnosis not present

## 2017-02-14 DIAGNOSIS — E663 Overweight: Secondary | ICD-10-CM | POA: Insufficient documentation

## 2017-02-14 DIAGNOSIS — E2839 Other primary ovarian failure: Secondary | ICD-10-CM | POA: Insufficient documentation

## 2017-02-20 DIAGNOSIS — C44722 Squamous cell carcinoma of skin of right lower limb, including hip: Secondary | ICD-10-CM | POA: Diagnosis not present

## 2017-02-20 DIAGNOSIS — L988 Other specified disorders of the skin and subcutaneous tissue: Secondary | ICD-10-CM | POA: Diagnosis not present

## 2017-03-19 DIAGNOSIS — Z23 Encounter for immunization: Secondary | ICD-10-CM | POA: Diagnosis not present

## 2017-04-15 DIAGNOSIS — E6609 Other obesity due to excess calories: Secondary | ICD-10-CM | POA: Diagnosis not present

## 2017-04-15 DIAGNOSIS — Z1389 Encounter for screening for other disorder: Secondary | ICD-10-CM | POA: Diagnosis not present

## 2017-04-15 DIAGNOSIS — Z6826 Body mass index (BMI) 26.0-26.9, adult: Secondary | ICD-10-CM | POA: Diagnosis not present

## 2017-04-15 DIAGNOSIS — I10 Essential (primary) hypertension: Secondary | ICD-10-CM | POA: Diagnosis not present

## 2017-04-15 DIAGNOSIS — E119 Type 2 diabetes mellitus without complications: Secondary | ICD-10-CM | POA: Diagnosis not present

## 2017-04-15 DIAGNOSIS — E663 Overweight: Secondary | ICD-10-CM | POA: Diagnosis not present

## 2017-04-15 DIAGNOSIS — E559 Vitamin D deficiency, unspecified: Secondary | ICD-10-CM | POA: Diagnosis not present

## 2017-04-15 DIAGNOSIS — Z0001 Encounter for general adult medical examination with abnormal findings: Secondary | ICD-10-CM | POA: Diagnosis not present

## 2017-04-15 DIAGNOSIS — E782 Mixed hyperlipidemia: Secondary | ICD-10-CM | POA: Diagnosis not present

## 2017-06-12 DIAGNOSIS — D2239 Melanocytic nevi of other parts of face: Secondary | ICD-10-CM | POA: Diagnosis not present

## 2017-06-12 DIAGNOSIS — D225 Melanocytic nevi of trunk: Secondary | ICD-10-CM | POA: Diagnosis not present

## 2017-06-12 DIAGNOSIS — Z23 Encounter for immunization: Secondary | ICD-10-CM | POA: Diagnosis not present

## 2017-06-12 DIAGNOSIS — D2261 Melanocytic nevi of right upper limb, including shoulder: Secondary | ICD-10-CM | POA: Diagnosis not present

## 2017-06-12 DIAGNOSIS — Z86018 Personal history of other benign neoplasm: Secondary | ICD-10-CM | POA: Diagnosis not present

## 2017-06-12 DIAGNOSIS — L821 Other seborrheic keratosis: Secondary | ICD-10-CM | POA: Diagnosis not present

## 2017-06-12 DIAGNOSIS — D2272 Melanocytic nevi of left lower limb, including hip: Secondary | ICD-10-CM | POA: Diagnosis not present

## 2017-06-12 DIAGNOSIS — Z85828 Personal history of other malignant neoplasm of skin: Secondary | ICD-10-CM | POA: Diagnosis not present

## 2017-06-12 DIAGNOSIS — D2230 Melanocytic nevi of unspecified part of face: Secondary | ICD-10-CM | POA: Diagnosis not present

## 2017-06-27 DIAGNOSIS — Z961 Presence of intraocular lens: Secondary | ICD-10-CM | POA: Diagnosis not present

## 2017-06-27 DIAGNOSIS — E119 Type 2 diabetes mellitus without complications: Secondary | ICD-10-CM | POA: Diagnosis not present

## 2017-06-27 DIAGNOSIS — H02831 Dermatochalasis of right upper eyelid: Secondary | ICD-10-CM | POA: Diagnosis not present

## 2017-06-27 DIAGNOSIS — H1851 Endothelial corneal dystrophy: Secondary | ICD-10-CM | POA: Diagnosis not present

## 2017-06-27 DIAGNOSIS — H02834 Dermatochalasis of left upper eyelid: Secondary | ICD-10-CM | POA: Diagnosis not present

## 2017-06-27 DIAGNOSIS — H04123 Dry eye syndrome of bilateral lacrimal glands: Secondary | ICD-10-CM | POA: Diagnosis not present

## 2017-08-21 ENCOUNTER — Encounter (INDEPENDENT_AMBULATORY_CARE_PROVIDER_SITE_OTHER): Payer: Self-pay | Admitting: *Deleted

## 2018-01-27 DIAGNOSIS — E119 Type 2 diabetes mellitus without complications: Secondary | ICD-10-CM | POA: Diagnosis not present

## 2018-01-27 DIAGNOSIS — I10 Essential (primary) hypertension: Secondary | ICD-10-CM | POA: Diagnosis not present

## 2018-01-27 DIAGNOSIS — Z1389 Encounter for screening for other disorder: Secondary | ICD-10-CM | POA: Diagnosis not present

## 2018-01-27 DIAGNOSIS — J302 Other seasonal allergic rhinitis: Secondary | ICD-10-CM | POA: Diagnosis not present

## 2018-01-27 DIAGNOSIS — E785 Hyperlipidemia, unspecified: Secondary | ICD-10-CM | POA: Diagnosis not present

## 2018-01-27 DIAGNOSIS — Z0001 Encounter for general adult medical examination with abnormal findings: Secondary | ICD-10-CM | POA: Diagnosis not present

## 2018-01-27 DIAGNOSIS — E663 Overweight: Secondary | ICD-10-CM | POA: Diagnosis not present

## 2018-01-27 DIAGNOSIS — Z6826 Body mass index (BMI) 26.0-26.9, adult: Secondary | ICD-10-CM | POA: Diagnosis not present

## 2018-01-27 DIAGNOSIS — C50119 Malignant neoplasm of central portion of unspecified female breast: Secondary | ICD-10-CM | POA: Diagnosis not present

## 2018-01-27 DIAGNOSIS — E782 Mixed hyperlipidemia: Secondary | ICD-10-CM | POA: Diagnosis not present

## 2018-03-03 DIAGNOSIS — Z23 Encounter for immunization: Secondary | ICD-10-CM | POA: Diagnosis not present

## 2018-04-22 DIAGNOSIS — Z6826 Body mass index (BMI) 26.0-26.9, adult: Secondary | ICD-10-CM | POA: Diagnosis not present

## 2018-04-22 DIAGNOSIS — E119 Type 2 diabetes mellitus without complications: Secondary | ICD-10-CM | POA: Diagnosis not present

## 2018-04-22 DIAGNOSIS — E782 Mixed hyperlipidemia: Secondary | ICD-10-CM | POA: Diagnosis not present

## 2018-04-22 DIAGNOSIS — E663 Overweight: Secondary | ICD-10-CM | POA: Diagnosis not present

## 2018-04-22 DIAGNOSIS — I10 Essential (primary) hypertension: Secondary | ICD-10-CM | POA: Diagnosis not present

## 2018-05-01 DIAGNOSIS — E119 Type 2 diabetes mellitus without complications: Secondary | ICD-10-CM | POA: Diagnosis not present

## 2018-06-04 ENCOUNTER — Other Ambulatory Visit: Payer: Self-pay | Admitting: Family Medicine

## 2018-06-04 DIAGNOSIS — D2239 Melanocytic nevi of other parts of face: Secondary | ICD-10-CM | POA: Diagnosis not present

## 2018-06-04 DIAGNOSIS — L821 Other seborrheic keratosis: Secondary | ICD-10-CM | POA: Diagnosis not present

## 2018-06-04 DIAGNOSIS — D2272 Melanocytic nevi of left lower limb, including hip: Secondary | ICD-10-CM | POA: Diagnosis not present

## 2018-06-04 DIAGNOSIS — Z1231 Encounter for screening mammogram for malignant neoplasm of breast: Secondary | ICD-10-CM

## 2018-06-04 DIAGNOSIS — L570 Actinic keratosis: Secondary | ICD-10-CM | POA: Diagnosis not present

## 2018-06-04 DIAGNOSIS — Z23 Encounter for immunization: Secondary | ICD-10-CM | POA: Diagnosis not present

## 2018-06-04 DIAGNOSIS — Z85828 Personal history of other malignant neoplasm of skin: Secondary | ICD-10-CM | POA: Diagnosis not present

## 2018-06-04 DIAGNOSIS — D2230 Melanocytic nevi of unspecified part of face: Secondary | ICD-10-CM | POA: Diagnosis not present

## 2018-06-04 DIAGNOSIS — Z86018 Personal history of other benign neoplasm: Secondary | ICD-10-CM | POA: Diagnosis not present

## 2018-06-04 DIAGNOSIS — D2261 Melanocytic nevi of right upper limb, including shoulder: Secondary | ICD-10-CM | POA: Diagnosis not present

## 2018-06-04 DIAGNOSIS — D225 Melanocytic nevi of trunk: Secondary | ICD-10-CM | POA: Diagnosis not present

## 2018-07-01 DIAGNOSIS — E119 Type 2 diabetes mellitus without complications: Secondary | ICD-10-CM | POA: Diagnosis not present

## 2018-07-01 DIAGNOSIS — H1851 Endothelial corneal dystrophy: Secondary | ICD-10-CM | POA: Diagnosis not present

## 2018-07-01 DIAGNOSIS — H02834 Dermatochalasis of left upper eyelid: Secondary | ICD-10-CM | POA: Diagnosis not present

## 2018-07-01 DIAGNOSIS — Z961 Presence of intraocular lens: Secondary | ICD-10-CM | POA: Diagnosis not present

## 2018-07-01 DIAGNOSIS — H353131 Nonexudative age-related macular degeneration, bilateral, early dry stage: Secondary | ICD-10-CM | POA: Diagnosis not present

## 2018-07-01 DIAGNOSIS — H43393 Other vitreous opacities, bilateral: Secondary | ICD-10-CM | POA: Diagnosis not present

## 2018-07-01 DIAGNOSIS — H04123 Dry eye syndrome of bilateral lacrimal glands: Secondary | ICD-10-CM | POA: Diagnosis not present

## 2018-07-01 DIAGNOSIS — H02831 Dermatochalasis of right upper eyelid: Secondary | ICD-10-CM | POA: Diagnosis not present

## 2018-07-04 ENCOUNTER — Ambulatory Visit
Admission: RE | Admit: 2018-07-04 | Discharge: 2018-07-04 | Disposition: A | Discharge status: Home / Self Care | Payer: PPO | Source: Ambulatory Visit | Attending: Family Medicine | Admitting: Family Medicine

## 2018-07-04 DIAGNOSIS — Z1231 Encounter for screening mammogram for malignant neoplasm of breast: Secondary | ICD-10-CM

## 2018-07-07 ENCOUNTER — Other Ambulatory Visit: Payer: Self-pay | Admitting: Family Medicine

## 2018-07-07 DIAGNOSIS — R928 Other abnormal and inconclusive findings on diagnostic imaging of breast: Secondary | ICD-10-CM

## 2018-07-07 DIAGNOSIS — H35071 Retinal telangiectasis, right eye: Secondary | ICD-10-CM | POA: Diagnosis not present

## 2018-07-07 DIAGNOSIS — H353211 Exudative age-related macular degeneration, right eye, with active choroidal neovascularization: Secondary | ICD-10-CM | POA: Diagnosis not present

## 2018-07-07 DIAGNOSIS — H35351 Cystoid macular degeneration, right eye: Secondary | ICD-10-CM | POA: Diagnosis not present

## 2018-07-07 DIAGNOSIS — H353132 Nonexudative age-related macular degeneration, bilateral, intermediate dry stage: Secondary | ICD-10-CM | POA: Diagnosis not present

## 2018-07-10 DIAGNOSIS — H353211 Exudative age-related macular degeneration, right eye, with active choroidal neovascularization: Secondary | ICD-10-CM | POA: Diagnosis not present

## 2018-07-11 ENCOUNTER — Other Ambulatory Visit: Payer: Self-pay | Admitting: Family Medicine

## 2018-07-11 ENCOUNTER — Ambulatory Visit
Admission: RE | Admit: 2018-07-11 | Discharge: 2018-07-11 | Disposition: A | Discharge status: Home / Self Care | Payer: PPO | Source: Ambulatory Visit | Attending: Family Medicine | Admitting: Family Medicine

## 2018-07-11 DIAGNOSIS — N6489 Other specified disorders of breast: Secondary | ICD-10-CM | POA: Diagnosis not present

## 2018-07-11 DIAGNOSIS — R928 Other abnormal and inconclusive findings on diagnostic imaging of breast: Secondary | ICD-10-CM

## 2018-07-11 HISTORY — DX: Personal history of irradiation: Z92.3

## 2018-07-18 DIAGNOSIS — Z6826 Body mass index (BMI) 26.0-26.9, adult: Secondary | ICD-10-CM | POA: Diagnosis not present

## 2018-07-18 DIAGNOSIS — H3530 Unspecified macular degeneration: Secondary | ICD-10-CM | POA: Diagnosis not present

## 2018-07-18 DIAGNOSIS — E663 Overweight: Secondary | ICD-10-CM | POA: Diagnosis not present

## 2018-07-18 DIAGNOSIS — R0683 Snoring: Secondary | ICD-10-CM | POA: Diagnosis not present

## 2018-07-23 DIAGNOSIS — H3530 Unspecified macular degeneration: Secondary | ICD-10-CM | POA: Diagnosis not present

## 2018-07-23 DIAGNOSIS — R0683 Snoring: Secondary | ICD-10-CM | POA: Diagnosis not present

## 2018-07-25 DIAGNOSIS — G4730 Sleep apnea, unspecified: Secondary | ICD-10-CM | POA: Diagnosis not present

## 2018-07-27 DIAGNOSIS — G4730 Sleep apnea, unspecified: Secondary | ICD-10-CM | POA: Diagnosis not present

## 2018-08-04 DIAGNOSIS — E663 Overweight: Secondary | ICD-10-CM | POA: Diagnosis not present

## 2018-08-04 DIAGNOSIS — I10 Essential (primary) hypertension: Secondary | ICD-10-CM | POA: Diagnosis not present

## 2018-08-04 DIAGNOSIS — Z0001 Encounter for general adult medical examination with abnormal findings: Secondary | ICD-10-CM | POA: Diagnosis not present

## 2018-08-04 DIAGNOSIS — Z1389 Encounter for screening for other disorder: Secondary | ICD-10-CM | POA: Diagnosis not present

## 2018-08-04 DIAGNOSIS — Z6826 Body mass index (BMI) 26.0-26.9, adult: Secondary | ICD-10-CM | POA: Diagnosis not present

## 2018-08-04 DIAGNOSIS — E785 Hyperlipidemia, unspecified: Secondary | ICD-10-CM | POA: Diagnosis not present

## 2018-08-04 DIAGNOSIS — E119 Type 2 diabetes mellitus without complications: Secondary | ICD-10-CM | POA: Diagnosis not present

## 2018-08-04 DIAGNOSIS — K219 Gastro-esophageal reflux disease without esophagitis: Secondary | ICD-10-CM | POA: Diagnosis not present

## 2018-08-07 DIAGNOSIS — G4733 Obstructive sleep apnea (adult) (pediatric): Secondary | ICD-10-CM | POA: Diagnosis not present

## 2018-09-30 DIAGNOSIS — G4733 Obstructive sleep apnea (adult) (pediatric): Secondary | ICD-10-CM | POA: Diagnosis not present

## 2018-10-14 DIAGNOSIS — H35351 Cystoid macular degeneration, right eye: Secondary | ICD-10-CM | POA: Diagnosis not present

## 2018-10-14 DIAGNOSIS — H35071 Retinal telangiectasis, right eye: Secondary | ICD-10-CM | POA: Diagnosis not present

## 2018-10-14 DIAGNOSIS — H353132 Nonexudative age-related macular degeneration, bilateral, intermediate dry stage: Secondary | ICD-10-CM | POA: Diagnosis not present

## 2018-10-14 DIAGNOSIS — H353211 Exudative age-related macular degeneration, right eye, with active choroidal neovascularization: Secondary | ICD-10-CM | POA: Diagnosis not present

## 2018-10-31 DIAGNOSIS — G4733 Obstructive sleep apnea (adult) (pediatric): Secondary | ICD-10-CM | POA: Diagnosis not present

## 2018-11-26 DIAGNOSIS — H353211 Exudative age-related macular degeneration, right eye, with active choroidal neovascularization: Secondary | ICD-10-CM | POA: Diagnosis not present

## 2018-11-26 DIAGNOSIS — H35071 Retinal telangiectasis, right eye: Secondary | ICD-10-CM | POA: Diagnosis not present

## 2018-11-26 DIAGNOSIS — G4733 Obstructive sleep apnea (adult) (pediatric): Secondary | ICD-10-CM | POA: Diagnosis not present

## 2018-11-26 DIAGNOSIS — H35351 Cystoid macular degeneration, right eye: Secondary | ICD-10-CM | POA: Diagnosis not present

## 2018-11-30 DIAGNOSIS — G4733 Obstructive sleep apnea (adult) (pediatric): Secondary | ICD-10-CM | POA: Diagnosis not present

## 2018-12-15 DIAGNOSIS — H35071 Retinal telangiectasis, right eye: Secondary | ICD-10-CM | POA: Diagnosis not present

## 2018-12-15 DIAGNOSIS — H353211 Exudative age-related macular degeneration, right eye, with active choroidal neovascularization: Secondary | ICD-10-CM | POA: Diagnosis not present

## 2018-12-15 DIAGNOSIS — H35351 Cystoid macular degeneration, right eye: Secondary | ICD-10-CM | POA: Diagnosis not present

## 2018-12-31 DIAGNOSIS — G4733 Obstructive sleep apnea (adult) (pediatric): Secondary | ICD-10-CM | POA: Diagnosis not present

## 2019-01-02 DIAGNOSIS — G4733 Obstructive sleep apnea (adult) (pediatric): Secondary | ICD-10-CM | POA: Diagnosis not present

## 2019-01-13 ENCOUNTER — Ambulatory Visit
Admission: RE | Admit: 2019-01-13 | Discharge: 2019-01-13 | Disposition: A | Discharge status: Home / Self Care | Payer: PPO | Source: Ambulatory Visit | Attending: Family Medicine | Admitting: Family Medicine

## 2019-01-13 ENCOUNTER — Other Ambulatory Visit: Payer: Self-pay | Admitting: Family Medicine

## 2019-01-13 ENCOUNTER — Other Ambulatory Visit: Payer: Self-pay

## 2019-01-13 DIAGNOSIS — N6489 Other specified disorders of breast: Secondary | ICD-10-CM

## 2019-01-13 DIAGNOSIS — R928 Other abnormal and inconclusive findings on diagnostic imaging of breast: Secondary | ICD-10-CM | POA: Diagnosis not present

## 2019-01-26 DIAGNOSIS — G4733 Obstructive sleep apnea (adult) (pediatric): Secondary | ICD-10-CM | POA: Diagnosis not present

## 2019-01-26 DIAGNOSIS — H353132 Nonexudative age-related macular degeneration, bilateral, intermediate dry stage: Secondary | ICD-10-CM | POA: Diagnosis not present

## 2019-01-26 DIAGNOSIS — H35351 Cystoid macular degeneration, right eye: Secondary | ICD-10-CM | POA: Diagnosis not present

## 2019-01-26 DIAGNOSIS — H353212 Exudative age-related macular degeneration, right eye, with inactive choroidal neovascularization: Secondary | ICD-10-CM | POA: Diagnosis not present

## 2019-01-26 DIAGNOSIS — H35071 Retinal telangiectasis, right eye: Secondary | ICD-10-CM | POA: Diagnosis not present

## 2019-02-02 DIAGNOSIS — G4733 Obstructive sleep apnea (adult) (pediatric): Secondary | ICD-10-CM | POA: Diagnosis not present

## 2019-02-09 DIAGNOSIS — I10 Essential (primary) hypertension: Secondary | ICD-10-CM | POA: Diagnosis not present

## 2019-02-09 DIAGNOSIS — E663 Overweight: Secondary | ICD-10-CM | POA: Diagnosis not present

## 2019-02-09 DIAGNOSIS — H3530 Unspecified macular degeneration: Secondary | ICD-10-CM | POA: Diagnosis not present

## 2019-02-09 DIAGNOSIS — R7309 Other abnormal glucose: Secondary | ICD-10-CM | POA: Diagnosis not present

## 2019-02-09 DIAGNOSIS — E559 Vitamin D deficiency, unspecified: Secondary | ICD-10-CM | POA: Diagnosis not present

## 2019-02-09 DIAGNOSIS — E7849 Other hyperlipidemia: Secondary | ICD-10-CM | POA: Diagnosis not present

## 2019-02-09 DIAGNOSIS — Z6827 Body mass index (BMI) 27.0-27.9, adult: Secondary | ICD-10-CM | POA: Diagnosis not present

## 2019-02-23 DIAGNOSIS — G4733 Obstructive sleep apnea (adult) (pediatric): Secondary | ICD-10-CM | POA: Diagnosis not present

## 2019-03-04 DIAGNOSIS — G4733 Obstructive sleep apnea (adult) (pediatric): Secondary | ICD-10-CM | POA: Diagnosis not present

## 2019-03-10 DIAGNOSIS — Z23 Encounter for immunization: Secondary | ICD-10-CM | POA: Diagnosis not present

## 2019-03-24 DIAGNOSIS — L820 Inflamed seborrheic keratosis: Secondary | ICD-10-CM | POA: Diagnosis not present

## 2019-03-24 DIAGNOSIS — Z23 Encounter for immunization: Secondary | ICD-10-CM | POA: Diagnosis not present

## 2019-03-24 DIAGNOSIS — D485 Neoplasm of uncertain behavior of skin: Secondary | ICD-10-CM | POA: Diagnosis not present

## 2019-03-24 DIAGNOSIS — L570 Actinic keratosis: Secondary | ICD-10-CM | POA: Diagnosis not present

## 2019-04-04 DIAGNOSIS — G4733 Obstructive sleep apnea (adult) (pediatric): Secondary | ICD-10-CM | POA: Diagnosis not present

## 2019-05-04 DIAGNOSIS — G4733 Obstructive sleep apnea (adult) (pediatric): Secondary | ICD-10-CM | POA: Diagnosis not present

## 2019-05-13 DIAGNOSIS — Z6827 Body mass index (BMI) 27.0-27.9, adult: Secondary | ICD-10-CM | POA: Diagnosis not present

## 2019-05-13 DIAGNOSIS — E119 Type 2 diabetes mellitus without complications: Secondary | ICD-10-CM | POA: Diagnosis not present

## 2019-05-13 DIAGNOSIS — I10 Essential (primary) hypertension: Secondary | ICD-10-CM | POA: Diagnosis not present

## 2019-05-13 DIAGNOSIS — E663 Overweight: Secondary | ICD-10-CM | POA: Diagnosis not present

## 2019-05-13 DIAGNOSIS — E7849 Other hyperlipidemia: Secondary | ICD-10-CM | POA: Diagnosis not present

## 2019-05-22 ENCOUNTER — Other Ambulatory Visit: Payer: Self-pay | Admitting: Emergency Medicine

## 2019-05-22 ENCOUNTER — Ambulatory Visit
Admission: EM | Admit: 2019-05-22 | Discharge: 2019-05-22 | Disposition: A | Discharge status: Home / Self Care | Payer: PPO

## 2019-05-22 ENCOUNTER — Other Ambulatory Visit: Payer: Self-pay

## 2019-05-22 DIAGNOSIS — N6322 Unspecified lump in the left breast, upper inner quadrant: Secondary | ICD-10-CM

## 2019-05-22 DIAGNOSIS — R928 Other abnormal and inconclusive findings on diagnostic imaging of breast: Secondary | ICD-10-CM

## 2019-05-22 DIAGNOSIS — N644 Mastodynia: Secondary | ICD-10-CM | POA: Diagnosis not present

## 2019-05-22 DIAGNOSIS — N6453 Retraction of nipple: Secondary | ICD-10-CM

## 2019-05-22 NOTE — Discharge Instructions (Signed)
Apply ice or heat as needed Take ibuprofen and/or tylenol as needed for pain control Breast ultrasound/ mammogram ordered.  The [facility] should be in contact with you to schedule that appointment.  Please call them or the office if you have not heard from them by next week.   Please follow up with PCP or oncologist following the results of your test(s) Return or go to the ED if you have any new or worsening symptoms fever, chills, nausea, vomiting, redness, swelling, discharge from nipple, etc..Marland Kitchen

## 2019-05-22 NOTE — ED Triage Notes (Signed)
Pt presents with complaints of left breast pain that started this morning. States that the area is warm, tender and swollen. Denies relief with otc treatment. Pt had breast cancer in her right breast and they have been following up with her for the left side. She tried to get in with her doctor today and they could not see her.

## 2019-05-22 NOTE — ED Provider Notes (Signed)
[facility]   [id number] 05/22/19 Arrival Time: 1250  CC: Breast pain  SUBJECTIVE:  Ashley Huang is a 84 y.o. female hx significant for RT sided breast cancer treated in 1989, who presents with left sided breast pain that began this morning.  Denies trauma, or precipitating event.  Localizes the pain to left breast.  Describes it as warm, and swollen.  Has tried ice with temporary relief.  Tender to the touch.  Complains of associated LT nipple retraction.  Denies fever, chills, nausea, vomiting, erythema, skin changes, dimpling, nipple discharge, LAD, SOB, chest pain, abdominal pain, changes in bowel or bladder function.    Tried to make appt with oncologist, but was unable to get in.  ROS: As per HPI.  All other pertinent ROS negative.     Past Medical History:  Diagnosis Date  . Breast cancer ([place]) 1989.  . DM (diabetes mellitus) ([place])    type 2-diet controlled  . Hypertension   . Personal history of radiation therapy    Past Surgical History:  Procedure Laterality Date  . BREAST LUMPECTOMY Right   . CHOLECYSTECTOMY    . COLONOSCOPY N/A 08/08/2012   Procedure: COLONOSCOPY;  Surgeon: Rogene Houston, MD;  Location: [facility];  Service: Endoscopy;  Laterality: N/A;  1015-moved to 9:30 Ann notified pt  . TONSILLECTOMY     Allergies  Allergen Reactions  . Antihistamines, Diphenhydramine-Type Anaphylaxis and Swelling  . Red Dye Rash  . Sulfa Antibiotics Itching and Rash   No current facility-administered medications on file prior to encounter.   Current Outpatient Medications on File Prior to Encounter  Medication Sig Dispense Refill  . calcium carbonate (OS-CAL) 600 MG TABS Take 600 mg by mouth daily.    . cholecalciferol (VITAMIN D) 1000 UNITS tablet Take 1,000 Units by mouth daily.    . mometasone (NASONEX) 50 MCG/ACT nasal spray Place 2 sprays into the nose daily.    . montelukast (SINGULAIR) 10 MG tablet Take 10 mg by mouth as needed (Allergies).      . Multiple Vitamins-Minerals (PRESERVISION AREDS 2 PO) Take by mouth.    . [DISCONTINUED] lisinopril (PRINIVIL,ZESTRIL) 40 MG tablet Take 40 mg by mouth daily.    . [DISCONTINUED] omeprazole (PRILOSEC) 20 MG capsule Take 1 capsule (20 mg total) by mouth 2 (two) times daily before a meal. 90 capsule 3  . [DISCONTINUED] pantoprazole (PROTONIX) 40 MG tablet Take 40 mg by mouth 2 (two) times daily before a meal.     Social History   Socioeconomic History  . Marital status: Married    Spouse name: Not on file  . Number of children: Not on file  . Years of education: Not on file  . Highest education level: Not on file  Occupational History  . Not on file  Tobacco Use  . Smoking status: Never Smoker  Substance and Sexual Activity  . Alcohol use: No  . Drug use: No  . Sexual activity: Not on file  Other Topics Concern  . Not on file  Social History Narrative  . Not on file   Social Determinants of Health   Financial Resource Strain:   . Difficulty of Paying Living Expenses: Not on file  Food Insecurity:   . Worried About Charity fundraiser in the Last Year: Not on file  . Ran Out of Food in the Last Year: Not on file  Transportation Needs:   . Lack of Transportation (Medical): Not on file  .  Lack of Transportation (Non-Medical): Not on file  Physical Activity:   . Days of Exercise per Week: Not on file  . Minutes of Exercise per Session: Not on file  Stress:   . Feeling of Stress : Not on file  Social Connections:   . Frequency of Communication with Friends and Family: Not on file  . Frequency of Social Gatherings with Friends and Family: Not on file  . Attends Religious Services: Not on file  . Active Member of Clubs or Organizations: Not on file  . Attends Archivist Meetings: Not on file  . Marital Status: Not on file  Intimate Partner Violence:   . Fear of Current or Ex-Partner: Not on file  . Emotionally Abused: Not on file  . Physically Abused: Not on  file  . Sexually Abused: Not on file   Family History  Problem Relation Age of Onset  . Hypertension Mother   . Emphysema Mother   . Cancer Father   . Colon cancer Sister     OBJECTIVE: Vitals:   05/22/19 1306  BP: (!) 139/52  Pulse: 93  Resp: 17  Temp: 98.1 F (36.7 C)  TempSrc: Oral  SpO2: 96%    General appearance: alert; no distress Head: NCAT Lungs: clear to auscultation bilaterally Heart: regular rate and rhythm.   Chest wall: Breast appear symmetrical without obvious skin changes or erythema; no axillary LAD; TTP over palpable oblong lump in 9-10 o'clock position, approximately 3.5 x 2 cm; LT nipple inversion; nipple expressed without obvious discharge Extremities: no edema Skin: warm and dry Psychological: alert and cooperative; normal mood and affect  ASSESSMENT & PLAN:  1. Breast pain   2. Breast lump on left side at 10 o'clock position   3. Nipple retraction     Apply ice or heat as needed Take ibuprofen and/or tylenol as needed for pain control Breast ultrasound/ mammogram ordered.  [facility] should be in contact with you to schedule that appointment.  Please call them or the office if you have not heard from them by next week.   Please follow up with PCP or oncologist following the results of your test(s) Return or go to the ED if you have any new or worsening symptoms fever, chills, nausea, vomiting, redness, swelling, discharge from nipple, etc...  Reviewed expectations re: course of current medical issues. Questions answered. Outlined signs and symptoms indicating need for more acute intervention. Patient verbalized understanding. After Visit Summary given.   Lestine Box, PA-C 05/22/19 1521

## 2019-05-26 ENCOUNTER — Ambulatory Visit
Admission: RE | Admit: 2019-05-26 | Discharge: 2019-05-26 | Disposition: A | Discharge status: Home / Self Care | Payer: PPO | Source: Ambulatory Visit | Attending: Emergency Medicine | Admitting: Emergency Medicine

## 2019-05-26 ENCOUNTER — Other Ambulatory Visit: Payer: Self-pay

## 2019-05-26 DIAGNOSIS — R928 Other abnormal and inconclusive findings on diagnostic imaging of breast: Secondary | ICD-10-CM

## 2019-05-26 DIAGNOSIS — N6489 Other specified disorders of breast: Secondary | ICD-10-CM | POA: Diagnosis not present

## 2019-05-26 DIAGNOSIS — R922 Inconclusive mammogram: Secondary | ICD-10-CM | POA: Diagnosis not present

## 2019-06-01 DIAGNOSIS — H353212 Exudative age-related macular degeneration, right eye, with inactive choroidal neovascularization: Secondary | ICD-10-CM | POA: Diagnosis not present

## 2019-06-01 DIAGNOSIS — H35071 Retinal telangiectasis, right eye: Secondary | ICD-10-CM | POA: Diagnosis not present

## 2019-06-01 DIAGNOSIS — R0683 Snoring: Secondary | ICD-10-CM | POA: Diagnosis not present

## 2019-06-01 DIAGNOSIS — H35351 Cystoid macular degeneration, right eye: Secondary | ICD-10-CM | POA: Diagnosis not present

## 2019-06-04 DIAGNOSIS — G4733 Obstructive sleep apnea (adult) (pediatric): Secondary | ICD-10-CM | POA: Diagnosis not present

## 2019-06-17 DIAGNOSIS — Z86018 Personal history of other benign neoplasm: Secondary | ICD-10-CM | POA: Diagnosis not present

## 2019-06-17 DIAGNOSIS — B353 Tinea pedis: Secondary | ICD-10-CM | POA: Diagnosis not present

## 2019-06-17 DIAGNOSIS — Z23 Encounter for immunization: Secondary | ICD-10-CM | POA: Diagnosis not present

## 2019-06-17 DIAGNOSIS — D2261 Melanocytic nevi of right upper limb, including shoulder: Secondary | ICD-10-CM | POA: Diagnosis not present

## 2019-06-17 DIAGNOSIS — D2230 Melanocytic nevi of unspecified part of face: Secondary | ICD-10-CM | POA: Diagnosis not present

## 2019-06-17 DIAGNOSIS — D485 Neoplasm of uncertain behavior of skin: Secondary | ICD-10-CM | POA: Diagnosis not present

## 2019-06-17 DIAGNOSIS — D2239 Melanocytic nevi of other parts of face: Secondary | ICD-10-CM | POA: Diagnosis not present

## 2019-06-17 DIAGNOSIS — L570 Actinic keratosis: Secondary | ICD-10-CM | POA: Diagnosis not present

## 2019-06-17 DIAGNOSIS — Z85828 Personal history of other malignant neoplasm of skin: Secondary | ICD-10-CM | POA: Diagnosis not present

## 2019-06-17 DIAGNOSIS — D225 Melanocytic nevi of trunk: Secondary | ICD-10-CM | POA: Diagnosis not present

## 2019-06-17 DIAGNOSIS — L821 Other seborrheic keratosis: Secondary | ICD-10-CM | POA: Diagnosis not present

## 2019-06-17 DIAGNOSIS — D2272 Melanocytic nevi of left lower limb, including hip: Secondary | ICD-10-CM | POA: Diagnosis not present

## 2019-06-22 DIAGNOSIS — H35351 Cystoid macular degeneration, right eye: Secondary | ICD-10-CM | POA: Diagnosis not present

## 2019-06-22 DIAGNOSIS — H353132 Nonexudative age-related macular degeneration, bilateral, intermediate dry stage: Secondary | ICD-10-CM | POA: Diagnosis not present

## 2019-06-22 DIAGNOSIS — H353212 Exudative age-related macular degeneration, right eye, with inactive choroidal neovascularization: Secondary | ICD-10-CM | POA: Diagnosis not present

## 2019-06-22 DIAGNOSIS — H35071 Retinal telangiectasis, right eye: Secondary | ICD-10-CM | POA: Diagnosis not present

## 2019-07-05 DIAGNOSIS — G4733 Obstructive sleep apnea (adult) (pediatric): Secondary | ICD-10-CM | POA: Diagnosis not present

## 2019-07-06 DIAGNOSIS — H353211 Exudative age-related macular degeneration, right eye, with active choroidal neovascularization: Secondary | ICD-10-CM | POA: Diagnosis not present

## 2019-07-06 DIAGNOSIS — H35071 Retinal telangiectasis, right eye: Secondary | ICD-10-CM | POA: Diagnosis not present

## 2019-07-06 DIAGNOSIS — H35351 Cystoid macular degeneration, right eye: Secondary | ICD-10-CM | POA: Diagnosis not present

## 2019-07-06 DIAGNOSIS — G4733 Obstructive sleep apnea (adult) (pediatric): Secondary | ICD-10-CM | POA: Diagnosis not present

## 2019-07-14 ENCOUNTER — Other Ambulatory Visit: Payer: Self-pay

## 2019-07-14 ENCOUNTER — Ambulatory Visit
Admission: RE | Admit: 2019-07-14 | Discharge: 2019-07-14 | Disposition: A | Discharge status: Home / Self Care | Payer: PPO | Source: Ambulatory Visit | Attending: Family Medicine | Admitting: Family Medicine

## 2019-07-14 DIAGNOSIS — R922 Inconclusive mammogram: Secondary | ICD-10-CM | POA: Diagnosis not present

## 2019-07-14 DIAGNOSIS — N6489 Other specified disorders of breast: Secondary | ICD-10-CM

## 2019-08-02 DIAGNOSIS — G4733 Obstructive sleep apnea (adult) (pediatric): Secondary | ICD-10-CM | POA: Diagnosis not present

## 2019-08-06 DIAGNOSIS — H04123 Dry eye syndrome of bilateral lacrimal glands: Secondary | ICD-10-CM | POA: Diagnosis not present

## 2019-08-06 DIAGNOSIS — H18513 Endothelial corneal dystrophy, bilateral: Secondary | ICD-10-CM | POA: Diagnosis not present

## 2019-08-06 DIAGNOSIS — H353122 Nonexudative age-related macular degeneration, left eye, intermediate dry stage: Secondary | ICD-10-CM | POA: Diagnosis not present

## 2019-08-06 DIAGNOSIS — H02834 Dermatochalasis of left upper eyelid: Secondary | ICD-10-CM | POA: Diagnosis not present

## 2019-08-06 DIAGNOSIS — E119 Type 2 diabetes mellitus without complications: Secondary | ICD-10-CM | POA: Diagnosis not present

## 2019-08-06 DIAGNOSIS — H02831 Dermatochalasis of right upper eyelid: Secondary | ICD-10-CM | POA: Diagnosis not present

## 2019-08-06 DIAGNOSIS — H43393 Other vitreous opacities, bilateral: Secondary | ICD-10-CM | POA: Diagnosis not present

## 2019-08-06 DIAGNOSIS — Z961 Presence of intraocular lens: Secondary | ICD-10-CM | POA: Diagnosis not present

## 2019-08-06 DIAGNOSIS — H353212 Exudative age-related macular degeneration, right eye, with inactive choroidal neovascularization: Secondary | ICD-10-CM | POA: Diagnosis not present

## 2019-08-11 DIAGNOSIS — G4733 Obstructive sleep apnea (adult) (pediatric): Secondary | ICD-10-CM | POA: Diagnosis not present

## 2019-08-11 DIAGNOSIS — H35351 Cystoid macular degeneration, right eye: Secondary | ICD-10-CM | POA: Diagnosis not present

## 2019-08-11 DIAGNOSIS — H35071 Retinal telangiectasis, right eye: Secondary | ICD-10-CM | POA: Diagnosis not present

## 2019-08-11 DIAGNOSIS — H353211 Exudative age-related macular degeneration, right eye, with active choroidal neovascularization: Secondary | ICD-10-CM | POA: Diagnosis not present

## 2019-09-02 DIAGNOSIS — G4733 Obstructive sleep apnea (adult) (pediatric): Secondary | ICD-10-CM | POA: Diagnosis not present

## 2019-09-22 ENCOUNTER — Encounter (INDEPENDENT_AMBULATORY_CARE_PROVIDER_SITE_OTHER): Payer: Self-pay | Admitting: Ophthalmology

## 2019-09-22 ENCOUNTER — Ambulatory Visit (INDEPENDENT_AMBULATORY_CARE_PROVIDER_SITE_OTHER): Payer: PPO | Admitting: Ophthalmology

## 2019-09-22 ENCOUNTER — Other Ambulatory Visit: Payer: Self-pay

## 2019-09-22 DIAGNOSIS — G4733 Obstructive sleep apnea (adult) (pediatric): Secondary | ICD-10-CM | POA: Diagnosis not present

## 2019-09-22 DIAGNOSIS — H35351 Cystoid macular degeneration, right eye: Secondary | ICD-10-CM | POA: Diagnosis not present

## 2019-09-22 DIAGNOSIS — H353211 Exudative age-related macular degeneration, right eye, with active choroidal neovascularization: Secondary | ICD-10-CM | POA: Diagnosis not present

## 2019-09-22 DIAGNOSIS — H35071 Retinal telangiectasis, right eye: Secondary | ICD-10-CM | POA: Insufficient documentation

## 2019-09-22 MED ORDER — BEVACIZUMAB CHEMO INJECTION 1.25MG/0.05ML SYRINGE FOR KALEIDOSCOPE
1.2500 mg | INTRAVITREAL | Status: AC | PRN
  Administered 2019-09-22: 1.25 mg via INTRAVITREAL

## 2019-09-22 NOTE — Progress Notes (Signed)
09/22/2019     CHIEF COMPLAINT Patient presents for Retina Follow Up   HISTORY OF PRESENT ILLNESS: Ashley Huang is a 84 y.o. female who presents to the clinic today for:   HPI    Retina Follow Up    Patient presents with  Wet AMD.  In right eye.  Severity is moderate.  Duration of 6 weeks.  Since onset it is stable.  I, the attending physician,  performed the HPI with the patient and updated documentation appropriately.          Comments    6 Week AMD f\u OD. Possible Avastin OD. OCT  Pt states vision is stable. Pt states she sees multiple floaters when she is out in the sun.       Last edited by Tilda Franco on 09/22/2019  9:53 AM. (History)      Referring physician: Sharilyn Sites, MD [address] [place],  [place] [address]  HISTORICAL INFORMATION:   Selected notes from the [facility]: No current outpatient medications on file. (Ophthalmic Drugs)   No current facility-administered medications for this visit. (Ophthalmic Drugs)   Current Outpatient Medications (Other)  Medication Sig  . calcium carbonate (OS-CAL) 600 MG TABS Take 600 mg by mouth daily.  . cholecalciferol (VITAMIN D) 1000 UNITS tablet Take 1,000 Units by mouth daily.  . mometasone (NASONEX) 50 MCG/ACT nasal spray Place 2 sprays into the nose daily.  . montelukast (SINGULAIR) 10 MG tablet Take 10 mg by mouth as needed (Allergies).   . Multiple Vitamins-Minerals (PRESERVISION AREDS 2 PO) Take by mouth.   No current facility-administered medications for this visit. (Other)      REVIEW OF SYSTEMS:    ALLERGIES Allergies  Allergen Reactions  . Antihistamines, Diphenhydramine-Type Anaphylaxis and Swelling  . Red Dye Rash  . Sulfa Antibiotics Itching and Rash    PAST MEDICAL HISTORY Past Medical History:  Diagnosis Date  . Breast cancer ([facility]) 1989.  . DM (diabetes mellitus) ([facility])    type 2-diet controlled  . Hypertension   .  Personal history of radiation therapy    Past Surgical History:  Procedure Laterality Date  . BREAST LUMPECTOMY Right   . CHOLECYSTECTOMY    . COLONOSCOPY N/A 08/08/2012   Procedure: COLONOSCOPY;  Surgeon: Rogene Houston, MD;  Location: [facility];  Service: Endoscopy;  Laterality: N/A;  1015-moved to 9:30 Ann notified pt  . TONSILLECTOMY      FAMILY HISTORY Family History  Problem Relation Age of Onset  . Hypertension Mother   . Emphysema Mother   . Cancer Father   . Colon cancer Sister     SOCIAL HISTORY Social History   Tobacco Use  . Smoking status: Never Smoker  . Smokeless tobacco: Never Used  Substance Use Topics  . Alcohol use: No  . Drug use: No         OPHTHALMIC EXAM: Base Eye Exam    Visual Acuity (Snellen - Linear)      Right Left   Dist [place] 20/40 + 20/20   Dist ph [place] 20/25 -1        Pupils      Pupils Dark Light Shape React APD   Right PERRL 3 2.5 Round Brisk None   Left PERRL 3 2.5 Round Brisk None       Visual Fields (Counting fingers)      Left Right  Full   Restrictions Partial outer superior nasal deficiency        Neuro/Psych    Oriented x3: Yes   Mood/Affect: Normal       Dilation    Right eye:         Slit Lamp and Fundus Exam    External Exam      Right Left   External Normal Normal       Slit Lamp Exam      Right Left   Lids/Lashes Normal Normal   Conjunctiva/Sclera White and quiet White and quiet   Cornea Clear Clear   Anterior Chamber Deep and quiet Deep and quiet   Iris Round and reactive Round and reactive   Lens Posterior chamber intraocular lens Posterior chamber intraocular lens   Anterior Vitreous Normal Normal       Fundus Exam      Right Left   Posterior Vitreous Posterior vitreous detachment    Disc Normal    C/D Ratio 0.3    Macula Retinal pigment epithelial mottling, no hemorrhage, Hard drusen, Macular atrophy    Vessels Normal    Periphery Normal           IMAGING AND PROCEDURES    Imaging and Procedures for 09/22/19  OCT, Retina - OU - Both Eyes       Right Eye Quality was good. Scan locations included subfoveal.   Left Eye Quality was good. Scan locations included subfoveal.        OCT, Retina - OU - Both Eyes       Right Eye Quality was good. Scan locations included subfoveal.   Left Eye Quality was good. Scan locations included subfoveal.                 ASSESSMENT/PLAN:  No problem-specific Assessment & Plan notes found for this encounter.      ICD-10-CM   1. Exudative age-related macular degeneration of right eye with active choroidal neovascularization (HCC)  H35.3211 OCT, Retina - OU - Both Eyes  2. Type 2 macular telangiectasis, right  H35.071 OCT, Retina - OU - Both Eyes  3. Cystoid macular edema of right eye  H35.351   4. Obstructive sleep apnea syndrome  G47.33     1.  Repeat intravitreal Avastin OD today.  Clear improvement in CME and wet ARMD OD on therapy.  2.  Follow-up with Dr. Armandina Gemma to replenish CPAP supplies  3.  Treatment of sleep apnea assist in the macular treatment and preservation of oxygenation to the fovea  Ophthalmic Meds Ordered this visit:  No orders of the defined types were placed in this encounter.      No follow-ups on file.  There are no Patient Instructions on file for this visit.   Explained the diagnoses, plan, and follow up with the patient and they expressed understanding.  Patient expressed understanding of the importance of proper follow up care.   Clent Demark Mischell Branford M.D. Diseases & Surgery of the Retina and Vitreous Retina & Diabetic [place] 09/22/19     Abbreviations: M myopia (nearsighted); A astigmatism; H hyperopia (farsighted); P presbyopia; Mrx spectacle prescription;  CTL contact lenses; OD right eye; OS left eye; OU both eyes  XT exotropia; ET esotropia; PEK punctate epithelial keratitis; PEE punctate epithelial erosions; DES dry eye syndrome; MGD meibomian gland  dysfunction; ATs artificial tears; PFAT's preservative free artificial tears; [place] nuclear sclerotic cataract; PSC posterior subcapsular cataract; ERM epi-retinal membrane; PVD posterior vitreous detachment; RD  retinal detachment; DM diabetes mellitus; DR diabetic retinopathy; NPDR non-proliferative diabetic retinopathy; PDR proliferative diabetic retinopathy; CSME clinically significant macular edema; DME diabetic macular edema; dbh dot blot hemorrhages; CWS cotton wool spot; POAG primary open angle glaucoma; C/D cup-to-disc ratio; HVF humphrey visual field; GVF goldmann visual field; OCT optical coherence tomography; IOP intraocular pressure; BRVO Branch retinal vein occlusion; CRVO central retinal vein occlusion; CRAO central retinal artery occlusion; BRAO branch retinal artery occlusion; RT retinal tear; SB scleral buckle; PPV pars plana vitrectomy; VH Vitreous hemorrhage; PRP panretinal laser photocoagulation; IVK intravitreal kenalog; VMT vitreomacular traction; MH Macular hole;  NVD neovascularization of the disc; NVE neovascularization elsewhere; AREDS age related eye disease study; ARMD age related macular degeneration; POAG primary open angle glaucoma; EBMD epithelial/anterior basement membrane dystrophy; ACIOL anterior chamber intraocular lens; IOL intraocular lens; PCIOL posterior chamber intraocular lens; Phaco/IOL phacoemulsification with intraocular lens placement; [place] photorefractive keratectomy; LASIK laser assisted in situ keratomileusis; HTN hypertension; DM diabetes mellitus; COPD chronic obstructive pulmonary disease

## 2019-09-22 NOTE — Assessment & Plan Note (Signed)
The nature of wet macular degeneration was discussed with the patient.  Forms of therapy reviewed include the use of Anti-VEGF medications injected painlessly into the eye, as well as other possible treatment modalities, including thermal laser therapy. Fellow eye involvement and risks were discussed with the patient. Upon the finding of wet age related macular degeneration, treatment will be offered. The treatment regimen is on a treat as needed basis with the intent to treat if necessary and extend interval of exams when possible. On average 1 out of 6 patients do not need lifetime therapy. However, the risk of recurrent disease is high for a lifetime.  Initially monthly, then periodic, examinations and evaluations will determine whether the next treatment is required on the day of the examination.  OD, with improved anatomy and acuity on R resumption of antiveg F therapy, intravitreal Avastin since February 2021 Repeat intravitreal Avastin today and exam in 6 weeks

## 2019-10-02 DIAGNOSIS — G4733 Obstructive sleep apnea (adult) (pediatric): Secondary | ICD-10-CM | POA: Diagnosis not present

## 2019-10-09 DIAGNOSIS — E7849 Other hyperlipidemia: Secondary | ICD-10-CM | POA: Diagnosis not present

## 2019-10-09 DIAGNOSIS — E119 Type 2 diabetes mellitus without complications: Secondary | ICD-10-CM | POA: Diagnosis not present

## 2019-10-09 DIAGNOSIS — H3530 Unspecified macular degeneration: Secondary | ICD-10-CM | POA: Diagnosis not present

## 2019-10-09 DIAGNOSIS — J302 Other seasonal allergic rhinitis: Secondary | ICD-10-CM | POA: Diagnosis not present

## 2019-10-09 DIAGNOSIS — Z0000 Encounter for general adult medical examination without abnormal findings: Secondary | ICD-10-CM | POA: Diagnosis not present

## 2019-10-09 DIAGNOSIS — R0683 Snoring: Secondary | ICD-10-CM | POA: Diagnosis not present

## 2019-10-09 DIAGNOSIS — Z6826 Body mass index (BMI) 26.0-26.9, adult: Secondary | ICD-10-CM | POA: Diagnosis not present

## 2019-10-09 DIAGNOSIS — Z1389 Encounter for screening for other disorder: Secondary | ICD-10-CM | POA: Diagnosis not present

## 2019-10-29 ENCOUNTER — Encounter (INDEPENDENT_AMBULATORY_CARE_PROVIDER_SITE_OTHER): Payer: Self-pay | Admitting: Ophthalmology

## 2019-10-29 ENCOUNTER — Other Ambulatory Visit: Payer: Self-pay

## 2019-10-29 ENCOUNTER — Ambulatory Visit (INDEPENDENT_AMBULATORY_CARE_PROVIDER_SITE_OTHER): Payer: PPO | Admitting: Ophthalmology

## 2019-10-29 DIAGNOSIS — H35351 Cystoid macular degeneration, right eye: Secondary | ICD-10-CM

## 2019-10-29 DIAGNOSIS — H35071 Retinal telangiectasis, right eye: Secondary | ICD-10-CM | POA: Diagnosis not present

## 2019-10-29 DIAGNOSIS — H353211 Exudative age-related macular degeneration, right eye, with active choroidal neovascularization: Secondary | ICD-10-CM | POA: Diagnosis not present

## 2019-10-29 MED ORDER — BEVACIZUMAB CHEMO INJECTION 1.25MG/0.05ML SYRINGE FOR KALEIDOSCOPE
1.2500 mg | INTRAVITREAL | Status: AC | PRN
  Administered 2019-10-29: 1.25 mg via INTRAVITREAL

## 2019-10-29 NOTE — Assessment & Plan Note (Signed)
OD improved, temporal fovea, on intravitreal Avastin will repeat today and examination in 6 weeks

## 2019-10-29 NOTE — Assessment & Plan Note (Signed)
Still on CPAP and he likes the aftereffects of more energy and is sleeping better

## 2019-10-29 NOTE — Progress Notes (Signed)
10/29/2019     CHIEF COMPLAINT Patient presents for Retina Follow Up   HISTORY OF PRESENT ILLNESS: Ashley Huang is a 84 y.o. female who presents to the clinic today for:   HPI    Retina Follow Up    Patient presents with  Wet AMD.  In right eye.  Duration of 6 weeks.  Since onset it is stable.          Comments    6 Week follow up- OCT OU, Poss Avastin OD Patient denies change in vision and overall has no complaints.        Last edited by Gerda Diss on 10/29/2019 10:00 AM. (History)      Referring physician: Sharilyn Sites, MD [address] [place],  [place] [address]  HISTORICAL INFORMATION:   Selected notes from the MEDICAL RECORD NUMBER       CURRENT MEDICATIONS: No current outpatient medications on file. (Ophthalmic Drugs)   No current facility-administered medications for this visit. (Ophthalmic Drugs)   Current Outpatient Medications (Other)  Medication Sig  . calcium carbonate (OS-CAL) 600 MG TABS Take 600 mg by mouth daily.  . cholecalciferol (VITAMIN D) 1000 UNITS tablet Take 1,000 Units by mouth daily.  . mometasone (NASONEX) 50 MCG/ACT nasal spray Place 2 sprays into the nose daily.  . montelukast (SINGULAIR) 10 MG tablet Take 10 mg by mouth as needed (Allergies).   . Multiple Vitamins-Minerals (PRESERVISION AREDS 2 PO) Take by mouth.   No current facility-administered medications for this visit. (Other)      REVIEW OF SYSTEMS:    ALLERGIES Allergies  Allergen Reactions  . Antihistamines, Diphenhydramine-Type Anaphylaxis and Swelling  . Red Dye Rash  . Sulfa Antibiotics Itching and Rash    PAST MEDICAL HISTORY Past Medical History:  Diagnosis Date  . Breast cancer ([facility]) 1989.  . DM (diabetes mellitus) ([facility])    type 2-diet controlled  . Hypertension   . Personal history of radiation therapy    Past Surgical History:  Procedure Laterality Date  . BREAST LUMPECTOMY Right   . CHOLECYSTECTOMY    . COLONOSCOPY N/A  08/08/2012   Procedure: COLONOSCOPY;  Surgeon: Rogene Houston, MD;  Location[facility];  Service: Endoscopy;  Laterality: N/A;  1015-moved to 9:30 Ann notified pt  . TONSILLECTOMY      FAMILY HISTORY Family History  Problem Relation Age of Onset  . Hypertension Mother   . Emphysema Mother   . Cancer Father   . Colon cancer Sister     SOCIAL HISTORY Social History   Tobacco Use  . Smoking status: Never Smoker  . Smokeless tobacco: Never Used  Substance Use Topics  . Alcohol use: No  . Drug use: No         OPHTHALMIC EXAM:  Base Eye Exam    Visual Acuity (Snellen - Linear)      Right Left   Dist [place] 20/40+2 20/25-1   Dist ph [place] 20/30-2        Tonometry (Tonopen, 10:08 AM)      Right Left   Pressure 12 13       Pupils      Pupils Dark Light Shape React APD   Right PERRL 3 2 Round Brisk None   Left PERRL 3 2 Round Brisk None       Visual Fields (Counting fingers)      Left Right    Full Full       Extraocular Movement  Right Left    Full Full       Neuro/Psych    Oriented x3: Yes   Mood/Affect: Normal       Dilation    Right eye: 1.0% Mydriacyl, 2.5% Phenylephrine @ 10:09 AM        Slit Lamp and Fundus Exam    External Exam      Right Left   External Normal Normal       Slit Lamp Exam      Right Left   Lids/Lashes Normal Normal   Conjunctiva/Sclera White and quiet White and quiet   Cornea Clear Clear   Anterior Chamber Deep and quiet Deep and quiet   Iris Round and reactive Round and reactive   Lens Posterior chamber intraocular lens Posterior chamber intraocular lens   Anterior Vitreous Normal Normal       Fundus Exam      Right Left   Posterior Vitreous Posterior vitreous detachment    Disc Normal    C/D Ratio 0.3    Macula Retinal pigment epithelial mottling, no hemorrhage, Hard drusen, Macular atrophy    Vessels Normal    Periphery Normal           IMAGING AND PROCEDURES  Imaging and Procedures for  10/29/19  OCT, Retina - OU - Both Eyes       Right Eye Quality was good. Scan locations included subfoveal. Central Foveal Thickness: 239. Progression has improved. Findings include intraretinal hyper-reflective material, no SRF.   Left Eye Quality was good. Scan locations included subfoveal. Central Foveal Thickness: 244. Progression has been stable. Findings include abnormal foveal contour, retinal drusen , intraretinal hyper-reflective material.   Notes OD with much less CME.  Avastin OD today and examination in 6 weeks  OS no new signs of wet AMD       Intravitreal Injection, Pharmacologic Agent - OD - Right Eye       Time Out 10/29/2019. 11:00 AM. Confirmed correct patient, procedure, site, and patient consented.   Anesthesia Anesthetic medications included [place] 3.5%.   Procedure Preparation included Ofloxacin , 10% betadine to eyelids. A 30 gauge needle was used.   Injection:  1.25 mg Bevacizumab (AVASTIN) SOLN   NDC: [address]-3, Lot: [id number]   Route: Intravitreal, Site: Right Eye, Waste: 0 mg  Post-op Post injection exam found visual acuity of at least counting fingers. The patient tolerated the procedure well. There were no complications. The patient received written and verbal post procedure care education. Post injection medications were not given.                 ASSESSMENT/PLAN:  Exudative age-related macular degeneration of right eye with active choroidal neovascularization (HCC) OD improved, temporal fovea, on intravitreal Avastin will repeat today and examination in 6 weeks  Type 2 macular telangiectasis, right Still on CPAP and he likes the aftereffects of more energy and is sleeping better      ICD-10-CM   1. Exudative age-related macular degeneration of right eye with active choroidal neovascularization (HCC)  H35.3211 OCT, Retina - OU - Both Eyes    Intravitreal Injection, Pharmacologic Agent - OD - Right Eye    Bevacizumab (AVASTIN) SOLN  1.25 mg  2. Type 2 macular telangiectasis, right  H35.071   3. Cystoid macular edema of right eye  H35.351     1.  2.  3.  Ophthalmic Meds Ordered this visit:  Meds ordered this encounter  Medications  . Bevacizumab (AVASTIN) SOLN 1.25  mg       Return in about 6 weeks (around 12/10/2019) for OD, AVASTIN OCT.  There are no Patient Instructions on file for this visit.   Explained the diagnoses, plan, and follow up with the patient and they expressed understanding.  Patient expressed understanding of the importance of proper follow up care.   Clent Demark Shayma Pfefferle M.D. Diseases & Surgery of the Retina and Vitreous Retina & Diabetic [place] 10/29/19     Abbreviations: M myopia (nearsighted); A astigmatism; H hyperopia (farsighted); P presbyopia; Mrx spectacle prescription;  CTL contact lenses; OD right eye; OS left eye; OU both eyes  XT exotropia; ET esotropia; PEK punctate epithelial keratitis; PEE punctate epithelial erosions; DES dry eye syndrome; MGD meibomian gland dysfunction; ATs artificial tears; PFAT's preservative free artificial tears; [place] nuclear sclerotic cataract; PSC posterior subcapsular cataract; ERM epi-retinal membrane; PVD posterior vitreous detachment; RD retinal detachment; DM diabetes mellitus; DR diabetic retinopathy; NPDR non-proliferative diabetic retinopathy; PDR proliferative diabetic retinopathy; CSME clinically significant macular edema; DME diabetic macular edema; dbh dot blot hemorrhages; CWS cotton wool spot; POAG primary open angle glaucoma; C/D cup-to-disc ratio; HVF humphrey visual field; GVF goldmann visual field; OCT optical coherence tomography; IOP intraocular pressure; BRVO Branch retinal vein occlusion; CRVO central retinal vein occlusion; CRAO central retinal artery occlusion; BRAO branch retinal artery occlusion; RT retinal tear; SB scleral buckle; PPV pars plana vitrectomy; VH Vitreous hemorrhage; PRP panretinal laser photocoagulation; IVK  intravitreal kenalog; VMT vitreomacular traction; MH Macular hole;  NVD neovascularization of the disc; NVE neovascularization elsewhere; AREDS age related eye disease study; ARMD age related macular degeneration; POAG primary open angle glaucoma; EBMD epithelial/anterior basement membrane dystrophy; ACIOL anterior chamber intraocular lens; IOL intraocular lens; PCIOL posterior chamber intraocular lens; Phaco/IOL phacoemulsification with intraocular lens placement; [place] photorefractive keratectomy; LASIK laser assisted in situ keratomileusis; HTN hypertension; DM diabetes mellitus; COPD chronic obstructive pulmonary disease

## 2019-12-03 ENCOUNTER — Encounter (INDEPENDENT_AMBULATORY_CARE_PROVIDER_SITE_OTHER): Payer: PPO | Admitting: Ophthalmology

## 2019-12-10 ENCOUNTER — Encounter (INDEPENDENT_AMBULATORY_CARE_PROVIDER_SITE_OTHER): Payer: PPO | Admitting: Ophthalmology

## 2019-12-10 DIAGNOSIS — G4733 Obstructive sleep apnea (adult) (pediatric): Secondary | ICD-10-CM | POA: Diagnosis not present

## 2019-12-14 ENCOUNTER — Ambulatory Visit (INDEPENDENT_AMBULATORY_CARE_PROVIDER_SITE_OTHER): Payer: PPO | Admitting: Ophthalmology

## 2019-12-14 ENCOUNTER — Other Ambulatory Visit: Payer: Self-pay

## 2019-12-14 ENCOUNTER — Encounter (INDEPENDENT_AMBULATORY_CARE_PROVIDER_SITE_OTHER): Payer: Self-pay | Admitting: Ophthalmology

## 2019-12-14 DIAGNOSIS — H353211 Exudative age-related macular degeneration, right eye, with active choroidal neovascularization: Secondary | ICD-10-CM

## 2019-12-14 DIAGNOSIS — E119 Type 2 diabetes mellitus without complications: Secondary | ICD-10-CM | POA: Diagnosis not present

## 2019-12-14 MED ORDER — BEVACIZUMAB CHEMO INJECTION 1.25MG/0.05ML SYRINGE FOR KALEIDOSCOPE
1.2500 mg | INTRAVITREAL | Status: AC | PRN
  Administered 2019-12-14: 1.25 mg via INTRAVITREAL

## 2019-12-14 NOTE — Patient Instructions (Signed)
Patient patient to notify the office should new visual acuity changes or distortions develop in the vision

## 2019-12-14 NOTE — Progress Notes (Signed)
12/14/2019     CHIEF COMPLAINT Patient presents for Retina Follow Up   HISTORY OF PRESENT ILLNESS: Ashley Huang is a 84 y.o. female who presents to the clinic today for:   HPI    Retina Follow Up    Patient presents with  Wet AMD.  In right eye.  This started 7 weeks ago.  Severity is mild.  Duration of 7 weeks.  Since onset it is stable.          Comments    7 Week AMD F/U OD, poss Avastin OD  Pt denies noticeable changes to [place] OU since last visit. Pt denies ocular pain, flashes of light, or changes to intermittent floaters. Pt sts sunglasses help the floaters OD. No floaters reported OS. Pt reports continuing to take AREDS 2 vitamins BID PO.        Last edited by Rockie Neighbours, [place] on 12/14/2019  2:15 PM. (History)      Referring physician: Sharilyn Sites, MD [address] [place],  [place] [id number]  HISTORICAL INFORMATION:   Selected notes from the [facility]: No current outpatient medications on file. (Ophthalmic Drugs)   No current facility-administered medications for this visit. (Ophthalmic Drugs)   Current Outpatient Medications (Other)  Medication Sig  . calcium carbonate (OS-CAL) 600 MG TABS Take 600 mg by mouth daily.  . cholecalciferol (VITAMIN D) 1000 UNITS tablet Take 1,000 Units by mouth daily.  . mometasone (NASONEX) 50 MCG/ACT nasal spray Place 2 sprays into the nose daily.  . montelukast (SINGULAIR) 10 MG tablet Take 10 mg by mouth as needed (Allergies).   . Multiple Vitamins-Minerals (PRESERVISION AREDS 2 PO) Take by mouth.   No current facility-administered medications for this visit. (Other)      REVIEW OF SYSTEMS:    ALLERGIES Allergies  Allergen Reactions  . Antihistamines, Diphenhydramine-Type Anaphylaxis and Swelling  . Red Dye Rash  . Sulfa Antibiotics Itching and Rash    PAST MEDICAL HISTORY Past Medical History:  Diagnosis Date  . Breast cancer ([place]) 1989.  . DM  (diabetes mellitus) ([place])    type 2-diet controlled  . Hypertension   . Personal history of radiation therapy    Past Surgical History:  Procedure Laterality Date  . BREAST LUMPECTOMY Right   . CHOLECYSTECTOMY    . COLONOSCOPY N/A 08/08/2012   Procedure: COLONOSCOPY;  Surgeon: Rogene Houston, MD;  Location: [facility];  Service: Endoscopy;  Laterality: N/A;  1015-moved to 9:30 Ann notified pt  . TONSILLECTOMY      FAMILY HISTORY Family History  Problem Relation Age of Onset  . Hypertension Mother   . Emphysema Mother   . Cancer Father   . Colon cancer Sister     SOCIAL HISTORY Social History   Tobacco Use  . Smoking status: Never Smoker  . Smokeless tobacco: Never Used  Substance Use Topics  . Alcohol use: No  . Drug use: No         OPHTHALMIC EXAM:  Base Eye Exam    Visual Acuity (ETDRS)      Right Left   Dist  20/40 +2 20/20 -1   Dist ph  20/30 +2        Tonometry (Tonopen, 2:20 PM)      Right Left   Pressure 13 14       Pupils      Pupils Dark Light Shape React APD  Right PERRL 3 2 Round Brisk None   Left PERRL 3 2 Round Brisk None       Visual Fields (Counting fingers)      Left Right    Full Full       Extraocular Movement      Right Left    Full Full       Neuro/Psych    Oriented x3: Yes   Mood/Affect: Normal       Dilation    Right eye: 1.0% Mydriacyl, 2.5% Phenylephrine @ 2:20 PM        Slit Lamp and Fundus Exam    External Exam      Right Left   External Normal Normal       Slit Lamp Exam      Right Left   Lids/Lashes Normal Normal   Conjunctiva/Sclera White and quiet White and quiet   Cornea Clear Clear   Anterior Chamber Deep and quiet Deep and quiet   Iris Round and reactive Round and reactive   Lens Posterior chamber intraocular lens Posterior chamber intraocular lens   Anterior Vitreous Normal Normal       Fundus Exam      Right Left   Posterior Vitreous Posterior vitreous detachment    Disc  Peripapillary atrophy    C/D Ratio 0.4    Macula Retinal pigment epithelial mottling, no hemorrhage, Hard drusen, pigmentary Macular atrophy    Vessels Normal    Periphery Normal           IMAGING AND PROCEDURES  Imaging and Procedures for 12/14/19  OCT, Retina - OU - Both Eyes       Right Eye Quality was good. Scan locations included subfoveal. Central Foveal Thickness: 239. Findings include abnormal foveal contour, no IRF, retinal drusen , subretinal hyper-reflective material.   Left Eye Quality was good. Scan locations included subfoveal. Central Foveal Thickness: 249. Findings include no SRF, no IRF, subretinal hyper-reflective material, retinal drusen .        Intravitreal Injection, Pharmacologic Agent - OD - Right Eye       Time Out 12/14/2019. 3:12 PM. Confirmed correct patient, procedure, site, and patient consented.   Anesthesia Topical anesthesia was used. Anesthetic medications included Akten 3.5%.   Procedure Preparation included 10% betadine to eyelids, 5% betadine to ocular surface, Ofloxacin . A 30 gauge needle was used.   Injection:  1.25 mg Bevacizumab (AVASTIN) SOLN   NDC: [address]-3, Lot: [id number]   Route: Intravitreal, Site: Right Eye, Waste: 0 mg  Post-op Post injection exam found visual acuity of at least counting fingers. The patient tolerated the procedure well. There were no complications. The patient received written and verbal post procedure care education. Post injection medications were not given.                 ASSESSMENT/PLAN:  Exudative age-related macular degeneration of right eye with active choroidal neovascularization (HCC) The nature of wet macular degeneration was discussed with the patient.  Forms of therapy reviewed include the use of Anti-VEGF medications injected painlessly into the eye, as well as other possible treatment modalities, including thermal laser therapy. Fellow eye involvement and risks were discussed with  the patient. Upon the finding of wet age related macular degeneration, treatment will be offered. The treatment regimen is on a treat as needed basis with the intent to treat if necessary and extend interval of exams when possible. On average 1 out of 6 patients do not need lifetime  therapy. However, the risk of recurrent disease is high for a lifetime.  Initially monthly, then periodic, examinations and evaluations will determine whether the next treatment is required on the day of the examination.  OD, in conjunction with retinal telangiectasis however intraretinal fluid has subsided using antiveg F medications. Currently at 6-week interval examination. Repeat injection Avastin today and exam OD in 8 weeks      ICD-10-CM   1. Exudative age-related macular degeneration of right eye with active choroidal neovascularization (HCC)  H35.3211 OCT, Retina - OU - Both Eyes    Intravitreal Injection, Pharmacologic Agent - OD - Right Eye    Bevacizumab (AVASTIN) SOLN 1.25 mg    1.  2.  3.  Ophthalmic Meds Ordered this visit:  Meds ordered this encounter  Medications  . Bevacizumab (AVASTIN) SOLN 1.25 mg       Return in about 8 weeks (around 02/08/2020) for DILATE OU, AVASTIN OCT, OD.  Patient Instructions  Patient patient to notify the office should new visual acuity changes or distortions develop in the vision    Explained the diagnoses, plan, and follow up with the patient and they expressed understanding.  Patient expressed understanding of the importance of proper follow up care.   Clent Demark Cheyann Blecha M.D. Diseases & Surgery of the Retina and Vitreous Retina & Diabetic [place] 12/14/19     Abbreviations: M myopia (nearsighted); A astigmatism; H hyperopia (farsighted); P presbyopia; Mrx spectacle prescription;  CTL contact lenses; OD right eye; OS left eye; OU both eyes  XT exotropia; ET esotropia; PEK punctate epithelial keratitis; PEE punctate epithelial erosions; DES dry eye  syndrome; MGD meibomian gland dysfunction; ATs artificial tears; PFAT's preservative free artificial tears; [place] nuclear sclerotic cataract; PSC posterior subcapsular cataract; ERM epi-retinal membrane; PVD posterior vitreous detachment; RD retinal detachment; DM diabetes mellitus; DR diabetic retinopathy; NPDR non-proliferative diabetic retinopathy; PDR proliferative diabetic retinopathy; CSME clinically significant macular edema; DME diabetic macular edema; dbh dot blot hemorrhages; CWS cotton wool spot; POAG primary open angle glaucoma; C/D cup-to-disc ratio; HVF humphrey visual field; GVF goldmann visual field; OCT optical coherence tomography; IOP intraocular pressure; BRVO Branch retinal vein occlusion; CRVO central retinal vein occlusion; CRAO central retinal artery occlusion; BRAO branch retinal artery occlusion; RT retinal tear; SB scleral buckle; PPV pars plana vitrectomy; VH Vitreous hemorrhage; PRP panretinal laser photocoagulation; IVK intravitreal kenalog; VMT vitreomacular traction; MH Macular hole;  NVD neovascularization of the disc; NVE neovascularization elsewhere; AREDS age related eye disease study; ARMD age related macular degeneration; POAG primary open angle glaucoma; EBMD epithelial/anterior basement membrane dystrophy; ACIOL anterior chamber intraocular lens; IOL intraocular lens; PCIOL posterior chamber intraocular lens; Phaco/IOL phacoemulsification with intraocular lens placement; [place] photorefractive keratectomy; LASIK laser assisted in situ keratomileusis; HTN hypertension; DM diabetes mellitus; COPD chronic obstructive pulmonary disease

## 2019-12-14 NOTE — Assessment & Plan Note (Signed)
The nature of wet macular degeneration was discussed with the patient.  Forms of therapy reviewed include the use of Anti-VEGF medications injected painlessly into the eye, as well as other possible treatment modalities, including thermal laser therapy. Fellow eye involvement and risks were discussed with the patient. Upon the finding of wet age related macular degeneration, treatment will be offered. The treatment regimen is on a treat as needed basis with the intent to treat if necessary and extend interval of exams when possible. On average 1 out of 6 patients do not need lifetime therapy. However, the risk of recurrent disease is high for a lifetime.  Initially monthly, then periodic, examinations and evaluations will determine whether the next treatment is required on the day of the examination.  OD, in conjunction with retinal telangiectasis however intraretinal fluid has subsided using antiveg F medications. Currently at 6-week interval examination. Repeat injection Avastin today and exam OD in 8 weeks

## 2020-02-08 ENCOUNTER — Encounter (INDEPENDENT_AMBULATORY_CARE_PROVIDER_SITE_OTHER): Payer: Self-pay | Admitting: Ophthalmology

## 2020-02-08 ENCOUNTER — Other Ambulatory Visit: Payer: Self-pay

## 2020-02-08 ENCOUNTER — Ambulatory Visit (INDEPENDENT_AMBULATORY_CARE_PROVIDER_SITE_OTHER): Payer: PPO | Admitting: Ophthalmology

## 2020-02-08 DIAGNOSIS — G4733 Obstructive sleep apnea (adult) (pediatric): Secondary | ICD-10-CM

## 2020-02-08 DIAGNOSIS — H35071 Retinal telangiectasis, right eye: Secondary | ICD-10-CM | POA: Diagnosis not present

## 2020-02-08 DIAGNOSIS — H353211 Exudative age-related macular degeneration, right eye, with active choroidal neovascularization: Secondary | ICD-10-CM

## 2020-02-08 DIAGNOSIS — H353113 Nonexudative age-related macular degeneration, right eye, advanced atrophic without subfoveal involvement: Secondary | ICD-10-CM | POA: Insufficient documentation

## 2020-02-08 DIAGNOSIS — H353112 Nonexudative age-related macular degeneration, right eye, intermediate dry stage: Secondary | ICD-10-CM | POA: Diagnosis not present

## 2020-02-08 MED ORDER — BEVACIZUMAB CHEMO INJECTION 1.25MG/0.05ML SYRINGE FOR KALEIDOSCOPE
1.2500 mg | INTRAVITREAL | Status: AC | PRN
  Administered 2020-02-08: 1.25 mg via INTRAVITREAL

## 2020-02-08 NOTE — Assessment & Plan Note (Signed)

## 2020-02-08 NOTE — Assessment & Plan Note (Signed)
On CPAP and will continue

## 2020-02-08 NOTE — Progress Notes (Signed)
02/08/2020     CHIEF COMPLAINT Patient presents for Retina Follow Up   HISTORY OF PRESENT ILLNESS: Ashley Huang is a 84 y.o. female who presents to the clinic today for:   HPI    Retina Follow Up    Patient presents with  Wet AMD.  In right eye.  This started 8 weeks ago.  Severity is mild.  Duration of 8 weeks.  Since onset it is stable.          Comments    8 Week AMD F/U OU, poss Avastin OD  Pt denies noticeable changes to [place] OU since last visit. Pt denies ocular pain, flashes of light, or floaters OU.         Last edited by Rockie Neighbours, [place] on 02/08/2020  2:37 PM. (History)      Referring physician: Sharilyn Sites, MD [address] [place],  [place] [address]  HISTORICAL INFORMATION:   Selected notes from the [facility]: No current outpatient medications on file. (Ophthalmic Drugs)   No current facility-administered medications for this visit. (Ophthalmic Drugs)   Current Outpatient Medications (Other)  Medication Sig  . calcium carbonate (OS-CAL) 600 MG TABS Take 600 mg by mouth daily.  . cholecalciferol (VITAMIN D) 1000 UNITS tablet Take 1,000 Units by mouth daily.  . mometasone (NASONEX) 50 MCG/ACT nasal spray Place 2 sprays into the nose daily.  . montelukast (SINGULAIR) 10 MG tablet Take 10 mg by mouth as needed (Allergies).   . Multiple Vitamins-Minerals (PRESERVISION AREDS 2 PO) Take by mouth.   No current facility-administered medications for this visit. (Other)      REVIEW OF SYSTEMS:    ALLERGIES Allergies  Allergen Reactions  . Antihistamines, Diphenhydramine-Type Anaphylaxis and Swelling  . Red Dye Rash  . Sulfa Antibiotics Itching and Rash    PAST MEDICAL HISTORY Past Medical History:  Diagnosis Date  . Breast cancer ([place]) 1989.  . DM (diabetes mellitus) ([place])    type 2-diet controlled  . Hypertension   . Personal history of radiation therapy    Past Surgical History:    Procedure Laterality Date  . BREAST LUMPECTOMY Right   . CHOLECYSTECTOMY    . COLONOSCOPY N/A 08/08/2012   Procedure: COLONOSCOPY;  Surgeon: Rogene Houston, MD;  Location[facility];  Service: Endoscopy;  Laterality: N/A;  1015-moved to 9:30 Ann notified pt  . TONSILLECTOMY      FAMILY HISTORY Family History  Problem Relation Age of Onset  . Hypertension Mother   . Emphysema Mother   . Cancer Father   . Colon cancer Sister     SOCIAL HISTORY Social History   Tobacco Use  . Smoking status: Never Smoker  . Smokeless tobacco: Never Used  Substance Use Topics  . Alcohol use: No  . Drug use: No         OPHTHALMIC EXAM: Base Eye Exam    Visual Acuity (ETDRS)      Right Left   Dist [place] 20/50 +2 20/20 -1   Dist ph [place] 20/30        Tonometry (Tonopen, 2:38 PM)      Right Left   Pressure 12 13       Pupils      Pupils Dark Light Shape React APD   Right PERRL 3 2 Round Brisk None   Left PERRL 3 2 Round Brisk None       Visual  Fields (Counting fingers)      Left Right    Full Full       Extraocular Movement      Right Left    Full Full       Neuro/Psych    Oriented x3: Yes   Mood/Affect: Normal       Dilation    Both eyes: 1.0% Mydriacyl, 2.5% Phenylephrine @ 2:41 PM        Slit Lamp and Fundus Exam    External Exam      Right Left   External Normal Normal       Slit Lamp Exam      Right Left   Lids/Lashes Normal Normal   Conjunctiva/Sclera White and quiet White and quiet   Cornea Clear Clear   Anterior Chamber Deep and quiet Deep and quiet   Iris Round and reactive Round and reactive   Lens Posterior chamber intraocular lens Posterior chamber intraocular lens   Anterior Vitreous Normal Normal       Fundus Exam      Right Left   Posterior Vitreous Posterior vitreous detachment Normal   Disc Peripapillary atrophy Normal   C/D Ratio 0.4 0.5   Macula Retinal pigment epithelial mottling, no hemorrhage, Hard drusen, pigmentary Macular  atrophy Hard drusen, no macular thickening, no hemorrhage   Vessels Normal Normal   Periphery Normal Normal          IMAGING AND PROCEDURES  Imaging and Procedures for 02/08/20  OCT, Retina - OU - Both Eyes       Right Eye Quality was good. Scan locations included subfoveal. Central Foveal Thickness: 242. Progression has improved. Findings include no IRF, retinal drusen , no SRF.   Left Eye Quality was good. Scan locations included subfoveal. Central Foveal Thickness: 249. Progression has been stable. Findings include retinal drusen , no SRF, no IRF.   Notes OD, much improved on Avastin at 8-week interval.  Also on CPAP use       Intravitreal Injection, Pharmacologic Agent - OD - Right Eye       Time Out 02/08/2020. 3:24 PM. Confirmed correct patient, procedure, site, and patient consented.   Anesthesia Topical anesthesia was used. Anesthetic medications included Akten 3.5%.   Procedure Preparation included 10% betadine to eyelids, 5% betadine to ocular surface, Ofloxacin . A supplied needle was used.   Injection:  1.25 mg Bevacizumab (AVASTIN) SOLN   NDC: [address]-5, Lot: [id number]   Route: Intravitreal, Site: Right Eye, Waste: 0 mg  Post-op Post injection exam found visual acuity of at least counting fingers. The patient tolerated the procedure well. There were no complications. The patient received written and verbal post procedure care education. Post injection medications were not given.                 ASSESSMENT/PLAN:  Obstructive sleep apnea syndrome She continues on CPAP, and enjoys the improved sense of wellbeing and more energy the next day I believe it helps with macular perfusion and oxygenation as well  Type 2 macular telangiectasis, right On CPAP and will continue  Exudative age-related macular degeneration of right eye with active choroidal neovascularization (HCC) Minor rap-like lesion on the right eye, improved on Avastin now at 8 weeks,  will repeat injection today and examination in 10 weeks      ICD-10-CM   1. Exudative age-related macular degeneration of right eye with active choroidal neovascularization (HCC)  H35.3211 OCT, Retina - OU - Both Eyes  Intravitreal Injection, Pharmacologic Agent - OD - Right Eye    Bevacizumab (AVASTIN) SOLN 1.25 mg  2. Obstructive sleep apnea syndrome  G47.33   3. Type 2 macular telangiectasis, right  H35.071     1.  2.  3.  Ophthalmic Meds Ordered this visit:  Meds ordered this encounter  Medications  . Bevacizumab (AVASTIN) SOLN 1.25 mg       Return in about 10 weeks (around 04/18/2020) for dilate, OD, AVASTIN OCT.  There are no Patient Instructions on file for this visit.   Explained the diagnoses, plan, and follow up with the patient and they expressed understanding.  Patient expressed understanding of the importance of proper follow up care.   Clent Demark Jaiyah Beining M.D. Diseases & Surgery of the Retina and Vitreous Retina & Diabetic [place] 02/08/20     Abbreviations: M myopia (nearsighted); A astigmatism; H hyperopia (farsighted); P presbyopia; Mrx spectacle prescription;  CTL contact lenses; OD right eye; OS left eye; OU both eyes  XT exotropia; ET esotropia; PEK punctate epithelial keratitis; PEE punctate epithelial erosions; DES dry eye syndrome; MGD meibomian gland dysfunction; ATs artificial tears; PFAT's preservative free artificial tears; [place] nuclear sclerotic cataract; PSC posterior subcapsular cataract; ERM epi-retinal membrane; PVD posterior vitreous detachment; RD retinal detachment; DM diabetes mellitus; DR diabetic retinopathy; NPDR non-proliferative diabetic retinopathy; PDR proliferative diabetic retinopathy; CSME clinically significant macular edema; DME diabetic macular edema; dbh dot blot hemorrhages; CWS cotton wool spot; POAG primary open angle glaucoma; C/D cup-to-disc ratio; HVF humphrey visual field; GVF goldmann visual field; OCT optical coherence  tomography; IOP intraocular pressure; BRVO Branch retinal vein occlusion; CRVO central retinal vein occlusion; CRAO central retinal artery occlusion; BRAO branch retinal artery occlusion; RT retinal tear; SB scleral buckle; PPV pars plana vitrectomy; VH Vitreous hemorrhage; PRP panretinal laser photocoagulation; IVK intravitreal kenalog; VMT vitreomacular traction; MH Macular hole;  NVD neovascularization of the disc; NVE neovascularization elsewhere; AREDS age related eye disease study; ARMD age related macular degeneration; POAG primary open angle glaucoma; EBMD epithelial/anterior basement membrane dystrophy; ACIOL anterior chamber intraocular lens; IOL intraocular lens; PCIOL posterior chamber intraocular lens; Phaco/IOL phacoemulsification with intraocular lens placement; [place] photorefractive keratectomy; LASIK laser assisted in situ keratomileusis; HTN hypertension; DM diabetes mellitus; COPD chronic obstructive pulmonary disease

## 2020-02-08 NOTE — Assessment & Plan Note (Signed)
She continues on CPAP, and enjoys the improved sense of wellbeing and more energy the next day I believe it helps with macular perfusion and oxygenation as well

## 2020-02-08 NOTE — Assessment & Plan Note (Signed)
Minor rap-like lesion on the right eye, improved on Avastin now at 8 weeks, will repeat injection today and examination in 10 weeks

## 2020-02-08 NOTE — Patient Instructions (Signed)
Patient instructed to contact the office promptly for new visual acuity distortions or decline

## 2020-02-11 ENCOUNTER — Ambulatory Visit: Payer: PPO | Attending: Internal Medicine

## 2020-02-11 DIAGNOSIS — Z23 Encounter for immunization: Secondary | ICD-10-CM

## 2020-02-11 NOTE — Progress Notes (Signed)
   Covid-19 Vaccination Clinic  Name:  Shaquandra Galano    MRN: [id number] DOB: 1935/11/21  02/11/2020  Ms. Toledo was observed post Covid-19 immunization for 15 minutes without incident. She was provided with Vaccine Information Sheet and instruction to access the V-Safe system.   Ms. Spillman was instructed to call [phone] with any severe reactions post vaccine: Marland Kitchen Difficulty breathing  . Swelling of face and throat  . A fast heartbeat  . A bad rash all over body  . Dizziness and weakness

## 2020-04-11 DIAGNOSIS — C50119 Malignant neoplasm of central portion of unspecified female breast: Secondary | ICD-10-CM | POA: Diagnosis not present

## 2020-04-11 DIAGNOSIS — Z23 Encounter for immunization: Secondary | ICD-10-CM | POA: Diagnosis not present

## 2020-04-11 DIAGNOSIS — E7849 Other hyperlipidemia: Secondary | ICD-10-CM | POA: Diagnosis not present

## 2020-04-11 DIAGNOSIS — E119 Type 2 diabetes mellitus without complications: Secondary | ICD-10-CM | POA: Diagnosis not present

## 2020-04-11 DIAGNOSIS — Z6827 Body mass index (BMI) 27.0-27.9, adult: Secondary | ICD-10-CM | POA: Diagnosis not present

## 2020-04-11 DIAGNOSIS — E663 Overweight: Secondary | ICD-10-CM | POA: Diagnosis not present

## 2020-04-11 DIAGNOSIS — E559 Vitamin D deficiency, unspecified: Secondary | ICD-10-CM | POA: Diagnosis not present

## 2020-04-11 DIAGNOSIS — H3530 Unspecified macular degeneration: Secondary | ICD-10-CM | POA: Diagnosis not present

## 2020-04-18 ENCOUNTER — Other Ambulatory Visit: Payer: Self-pay

## 2020-04-18 ENCOUNTER — Encounter (INDEPENDENT_AMBULATORY_CARE_PROVIDER_SITE_OTHER): Payer: Self-pay | Admitting: Ophthalmology

## 2020-04-18 ENCOUNTER — Ambulatory Visit (INDEPENDENT_AMBULATORY_CARE_PROVIDER_SITE_OTHER): Payer: PPO | Admitting: Ophthalmology

## 2020-04-18 DIAGNOSIS — H353122 Nonexudative age-related macular degeneration, left eye, intermediate dry stage: Secondary | ICD-10-CM

## 2020-04-18 DIAGNOSIS — H353211 Exudative age-related macular degeneration, right eye, with active choroidal neovascularization: Secondary | ICD-10-CM | POA: Diagnosis not present

## 2020-04-18 MED ORDER — BEVACIZUMAB 2.5 MG/0.1ML IZ SOSY
2.5000 mg | PREFILLED_SYRINGE | INTRAVITREAL | Status: AC | PRN
  Administered 2020-04-18: 2.5 mg via INTRAVITREAL

## 2020-04-18 NOTE — Progress Notes (Signed)
04/18/2020     CHIEF COMPLAINT Patient presents for Retina Follow Up   HISTORY OF PRESENT ILLNESS: Ashley Huang is a 84 y.o. female who presents to the clinic today for:   HPI    Retina Follow Up    Patient presents with  Wet AMD.  In right eye.  Severity is moderate.  Duration of 10 weeks.  Since onset it is stable.  I, the attending physician,  performed the HPI with the patient and updated documentation appropriately.          Comments    10 Week Wet AMD f\u OD. Possible Avastin OD. OCT  Pt states the amount of floaters have decreased since last visit. Denies any changes in vision.       Last edited by Tilda Franco on 04/18/2020  1:31 PM. (History)      Referring physician: Sharilyn Sites, MD [address] [place],  [place] [address]  HISTORICAL INFORMATION:   Selected notes from the MEDICAL RECORD NUMBER       CURRENT MEDICATIONS: No current outpatient medications on file. (Ophthalmic Drugs)   No current facility-administered medications for this visit. (Ophthalmic Drugs)   Current Outpatient Medications (Other)  Medication Sig  . calcium carbonate (OS-CAL) 600 MG TABS Take 600 mg by mouth daily.  . cholecalciferol (VITAMIN D) 1000 UNITS tablet Take 1,000 Units by mouth daily.  . mometasone (NASONEX) 50 MCG/ACT nasal spray Place 2 sprays into the nose daily.  . montelukast (SINGULAIR) 10 MG tablet Take 10 mg by mouth as needed (Allergies).   . Multiple Vitamins-Minerals (PRESERVISION AREDS 2 PO) Take by mouth.   No current facility-administered medications for this visit. (Other)      REVIEW OF SYSTEMS:    ALLERGIES Allergies  Allergen Reactions  . Antihistamines, Diphenhydramine-Type Anaphylaxis and Swelling  . Red Dye Rash  . Sulfa Antibiotics Itching and Rash    PAST MEDICAL HISTORY Past Medical History:  Diagnosis Date  . Breast cancer ([place]) 1989.  . DM (diabetes mellitus) ([place])    type 2-diet controlled  . Hypertension    . Personal history of radiation therapy    Past Surgical History:  Procedure Laterality Date  . BREAST LUMPECTOMY Right   . CHOLECYSTECTOMY    . COLONOSCOPY N/A 08/08/2012   Procedure: COLONOSCOPY;  Surgeon: Rogene Houston, MD;  Location: [facility];  Service: Endoscopy;  Laterality: N/A;  1015-moved to 9:30 Ann notified pt  . TONSILLECTOMY      FAMILY HISTORY Family History  Problem Relation Age of Onset  . Hypertension Mother   . Emphysema Mother   . Cancer Father   . Colon cancer Sister     SOCIAL HISTORY Social History   Tobacco Use  . Smoking status: Never Smoker  . Smokeless tobacco: Never Used  Substance Use Topics  . Alcohol use: No  . Drug use: No         OPHTHALMIC EXAM:  Base Eye Exam    Visual Acuity (Snellen - Linear)      Right Left   Dist [place] 20/50 20/20 -2   Dist ph [place] 20/30 -2        Tonometry (Tonopen, 1:36 PM)      Right Left   Pressure 11 10       Pupils      Pupils Dark Light Shape React APD   Right PERRL 3 2 Round Brisk None   Left PERRL 3 2 Round Brisk  None       Visual Fields (Counting fingers)      Left Right    Full Full       Neuro/Psych    Oriented x3: Yes   Mood/Affect: Normal       Dilation    Right eye: 1.0% Mydriacyl, 2.5% Phenylephrine @ 1:36 PM        Slit Lamp and Fundus Exam    External Exam      Right Left   External Normal Normal       Slit Lamp Exam      Right Left   Lids/Lashes Normal Normal   Conjunctiva/Sclera White and quiet White and quiet   Cornea Clear Clear   Anterior Chamber Deep and quiet Deep and quiet   Iris Round and reactive Round and reactive   Lens Posterior chamber intraocular lens Posterior chamber intraocular lens   Anterior Vitreous Normal Normal       Fundus Exam      Right Left   Posterior Vitreous Posterior vitreous detachment    Disc Peripapillary atrophy    C/D Ratio 0.3    Macula Retinal pigment epithelial mottling, no hemorrhage, Hard drusen, pigmentary  Macular atrophy, Geographic atrophy, Soft drusen    Vessels Normal    Periphery Normal           IMAGING AND PROCEDURES  Imaging and Procedures for 04/18/20  OCT, Retina - OU - Both Eyes       Right Eye Quality was good. Scan locations included subfoveal. Central Foveal Thickness: 244. Progression has improved.   Left Eye Quality was good. Scan locations included subfoveal. Central Foveal Thickness: 258. Progression has been stable.   Notes OD, with outer retinal atrophy but much less subretinal and intraretinal fluid on 10-week follow-up interval today post Avastin, will repeat Evaluation in 3 months after injection today       Intravitreal Injection, Pharmacologic Agent - OD - Right Eye       Time Out 04/18/2020. 2:52 PM. Confirmed correct patient, procedure, site, and patient consented.   Anesthesia Topical anesthesia was used. Anesthetic medications included Akten 3.5%.   Procedure Preparation included 10% betadine to eyelids, 5% betadine to ocular surface, Ofloxacin . A supplied needle was used.   Injection:  2.5 mg Bevacizumab (AVASTIN) 2.5mg /0.54mL SOSY   NDC: 38250-539-76, Lot: [id number]   Route: Intravitreal, Site: Right Eye  Post-op Post injection exam found visual acuity of at least counting fingers. The patient tolerated the procedure well. There were no complications. The patient received written and verbal post procedure care education. Post injection medications were not given.                 ASSESSMENT/PLAN:  Exudative age-related macular degeneration of right eye with active choroidal neovascularization (HCC) Edition vastly improved OD, on injection of Avastin currently at 10-week follow-up interval.  We will repeat today and examination in 3 months  Intermediate stage nonexudative age-related macular degeneration of left eye The nature of age--related macular degeneration was discussed with the patient as well as the distinction between dry  and wet types. Checking an Amsler Grid daily with advice to return immediately should a distortion develop, was given to the patient. The patient 's smoking status now and in the past was determined and advice based on the AREDS study was provided regarding the consumption of antioxidant supplements. AREDS 2 vitamin formulation was recommended. Consumption of dark leafy vegetables and fresh fruits of various colors was  recommended. Treatment modalities for wet macular degeneration particularly the use of intravitreal injections of anti-blood vessel growth factors was discussed with the patient. Avastin, Lucentis, and Eylea are the available options. On occasion, therapy includes the use of photodynamic therapy and thermal laser. Stressed to the patient do not rub eyes.  Patient was advised to check Amsler Grid daily and return immediately if changes are noted. Instructions on using the grid were given to the patient. All patient questions were answered.      ICD-10-CM   1. Exudative age-related macular degeneration of right eye with active choroidal neovascularization (HCC)  H35.3211 OCT, Retina - OU - Both Eyes    Intravitreal Injection, Pharmacologic Agent - OD - Right Eye    bevacizumab (AVASTIN) SOSY 2.5 mg  2. Intermediate stage nonexudative age-related macular degeneration of left eye  H35.3122     1.  Need injection intravitreal Avastin OD today, at 10-week interval  2.  Dilate OU next  3.  Ophthalmic Meds Ordered this visit:  Meds ordered this encounter  Medications  . bevacizumab (AVASTIN) SOSY 2.5 mg       Return in about 3 months (around 07/17/2020) for DILATE OU, AVASTIN OCT, OD.  There are no Patient Instructions on file for this visit.   Explained the diagnoses, plan, and follow up with the patient and they expressed understanding.  Patient expressed understanding of the importance of proper follow up care.   Clent Demark Debbora Ang M.D. Diseases & Surgery of the Retina and  Vitreous Retina & Diabetic [place] 04/18/20     Abbreviations: M myopia (nearsighted); A astigmatism; H hyperopia (farsighted); P presbyopia; Mrx spectacle prescription;  CTL contact lenses; OD right eye; OS left eye; OU both eyes  XT exotropia; ET esotropia; PEK punctate epithelial keratitis; PEE punctate epithelial erosions; DES dry eye syndrome; MGD meibomian gland dysfunction; ATs artificial tears; PFAT's preservative free artificial tears; [place] nuclear sclerotic cataract; PSC posterior subcapsular cataract; ERM epi-retinal membrane; PVD posterior vitreous detachment; RD retinal detachment; DM diabetes mellitus; DR diabetic retinopathy; NPDR non-proliferative diabetic retinopathy; PDR proliferative diabetic retinopathy; CSME clinically significant macular edema; DME diabetic macular edema; dbh dot blot hemorrhages; CWS cotton wool spot; POAG primary open angle glaucoma; C/D cup-to-disc ratio; HVF humphrey visual field; GVF goldmann visual field; OCT optical coherence tomography; IOP intraocular pressure; BRVO Branch retinal vein occlusion; CRVO central retinal vein occlusion; CRAO central retinal artery occlusion; BRAO branch retinal artery occlusion; RT retinal tear; SB scleral buckle; PPV pars plana vitrectomy; VH Vitreous hemorrhage; PRP panretinal laser photocoagulation; IVK intravitreal kenalog; VMT vitreomacular traction; MH Macular hole;  NVD neovascularization of the disc; NVE neovascularization elsewhere; AREDS age related eye disease study; ARMD age related macular degeneration; POAG primary open angle glaucoma; EBMD epithelial/anterior basement membrane dystrophy; ACIOL anterior chamber intraocular lens; IOL intraocular lens; PCIOL posterior chamber intraocular lens; Phaco/IOL phacoemulsification with intraocular lens placement; [place] photorefractive keratectomy; LASIK laser assisted in situ keratomileusis; HTN hypertension; DM diabetes mellitus; COPD chronic obstructive pulmonary disease

## 2020-04-18 NOTE — Assessment & Plan Note (Signed)
Edition vastly improved OD, on injection of Avastin currently at 10-week follow-up interval.  We will repeat today and examination in 3 months

## 2020-04-18 NOTE — Assessment & Plan Note (Signed)

## 2020-04-19 DIAGNOSIS — E7849 Other hyperlipidemia: Secondary | ICD-10-CM | POA: Diagnosis not present

## 2020-06-29 DIAGNOSIS — E663 Overweight: Secondary | ICD-10-CM | POA: Diagnosis not present

## 2020-06-29 DIAGNOSIS — Z1331 Encounter for screening for depression: Secondary | ICD-10-CM | POA: Diagnosis not present

## 2020-06-29 DIAGNOSIS — L989 Disorder of the skin and subcutaneous tissue, unspecified: Secondary | ICD-10-CM | POA: Diagnosis not present

## 2020-06-29 DIAGNOSIS — Z6827 Body mass index (BMI) 27.0-27.9, adult: Secondary | ICD-10-CM | POA: Diagnosis not present

## 2020-06-29 DIAGNOSIS — B351 Tinea unguium: Secondary | ICD-10-CM | POA: Diagnosis not present

## 2020-07-07 DIAGNOSIS — L821 Other seborrheic keratosis: Secondary | ICD-10-CM | POA: Diagnosis not present

## 2020-07-07 DIAGNOSIS — D2272 Melanocytic nevi of left lower limb, including hip: Secondary | ICD-10-CM | POA: Diagnosis not present

## 2020-07-07 DIAGNOSIS — D2239 Melanocytic nevi of other parts of face: Secondary | ICD-10-CM | POA: Diagnosis not present

## 2020-07-07 DIAGNOSIS — D2261 Melanocytic nevi of right upper limb, including shoulder: Secondary | ICD-10-CM | POA: Diagnosis not present

## 2020-07-07 DIAGNOSIS — L570 Actinic keratosis: Secondary | ICD-10-CM | POA: Diagnosis not present

## 2020-07-07 DIAGNOSIS — Z86018 Personal history of other benign neoplasm: Secondary | ICD-10-CM | POA: Diagnosis not present

## 2020-07-07 DIAGNOSIS — Z85828 Personal history of other malignant neoplasm of skin: Secondary | ICD-10-CM | POA: Diagnosis not present

## 2020-07-07 DIAGNOSIS — L820 Inflamed seborrheic keratosis: Secondary | ICD-10-CM | POA: Diagnosis not present

## 2020-07-07 DIAGNOSIS — D2230 Melanocytic nevi of unspecified part of face: Secondary | ICD-10-CM | POA: Diagnosis not present

## 2020-07-07 DIAGNOSIS — B353 Tinea pedis: Secondary | ICD-10-CM | POA: Diagnosis not present

## 2020-07-07 DIAGNOSIS — D225 Melanocytic nevi of trunk: Secondary | ICD-10-CM | POA: Diagnosis not present

## 2020-07-07 DIAGNOSIS — L578 Other skin changes due to chronic exposure to nonionizing radiation: Secondary | ICD-10-CM | POA: Diagnosis not present

## 2020-07-18 ENCOUNTER — Ambulatory Visit (INDEPENDENT_AMBULATORY_CARE_PROVIDER_SITE_OTHER): Payer: PPO | Admitting: Ophthalmology

## 2020-07-18 ENCOUNTER — Encounter (INDEPENDENT_AMBULATORY_CARE_PROVIDER_SITE_OTHER): Payer: Self-pay | Admitting: Ophthalmology

## 2020-07-18 ENCOUNTER — Other Ambulatory Visit: Payer: Self-pay

## 2020-07-18 DIAGNOSIS — H35351 Cystoid macular degeneration, right eye: Secondary | ICD-10-CM | POA: Diagnosis not present

## 2020-07-18 DIAGNOSIS — H43822 Vitreomacular adhesion, left eye: Secondary | ICD-10-CM | POA: Insufficient documentation

## 2020-07-18 DIAGNOSIS — H353211 Exudative age-related macular degeneration, right eye, with active choroidal neovascularization: Secondary | ICD-10-CM | POA: Diagnosis not present

## 2020-07-18 DIAGNOSIS — H353122 Nonexudative age-related macular degeneration, left eye, intermediate dry stage: Secondary | ICD-10-CM | POA: Diagnosis not present

## 2020-07-18 MED ORDER — BEVACIZUMAB 2.5 MG/0.1ML IZ SOSY
2.5000 mg | PREFILLED_SYRINGE | INTRAVITREAL | Status: AC | PRN
  Administered 2020-07-18: 2.5 mg via INTRAVITREAL

## 2020-07-18 NOTE — Progress Notes (Signed)
07/18/2020     CHIEF COMPLAINT Patient presents for Retina Follow Up (3 Mo F/U OU, poss Avastin OD//Pt reports "few floaters" off and on OD since last visit. Pt denies any other changes to [facility] or symptoms OU.)   HISTORY OF PRESENT ILLNESS: Ashley Huang is a 85 y.o. female who presents to the clinic today for:   HPI    Retina Follow Up    Patient presents with  Wet AMD.  In right eye.  This started 3 months ago.  Severity is mild.  Duration of 3 months.  Since onset it is stable. Additional comments: 3 Mo F/U OU, poss Avastin OD  Pt reports "few floaters" off and on OD since last visit. Pt denies any other changes to [place] or symptoms OU.       Last edited by Rockie Neighbours, [place] on 07/18/2020  1:21 PM. (History)      Referring physician: Sharilyn Sites, MD [address] [place],  [place] [address]  HISTORICAL INFORMATION:   Selected notes from the [place]: No current outpatient medications on file. (Ophthalmic Drugs)   No current facility-administered medications for this visit. (Ophthalmic Drugs)   Current Outpatient Medications (Other)  Medication Sig  . calcium carbonate (OS-CAL) 600 MG TABS Take 600 mg by mouth daily.  . cholecalciferol (VITAMIN D) 1000 UNITS tablet Take 1,000 Units by mouth daily.  . mometasone (NASONEX) 50 MCG/ACT nasal spray Place 2 sprays into the nose daily.  . montelukast (SINGULAIR) 10 MG tablet Take 10 mg by mouth as needed (Allergies).   . Multiple Vitamins-Minerals (PRESERVISION AREDS 2 PO) Take by mouth.   No current facility-administered medications for this visit. (Other)      REVIEW OF SYSTEMS:    ALLERGIES Allergies  Allergen Reactions  . Antihistamines, Diphenhydramine-Type Anaphylaxis and Swelling  . Red Dye Rash  . Sulfa Antibiotics Itching and Rash    PAST MEDICAL HISTORY Past Medical History:  Diagnosis Date  . Breast cancer ([place]) 1989.  . DM (diabetes mellitus) ([place])     type 2-diet controlled  . Hypertension   . Personal history of radiation therapy    Past Surgical History:  Procedure Laterality Date  . BREAST LUMPECTOMY Right   . CHOLECYSTECTOMY    . COLONOSCOPY N/A 08/08/2012   Procedure: COLONOSCOPY;  Surgeon: Rogene Houston, MD;  Location[facility];  Service: Endoscopy;  Laterality: N/A;  1015-moved to 9:30 Ann notified pt  . TONSILLECTOMY      FAMILY HISTORY Family History  Problem Relation Age of Onset  . Hypertension Mother   . Emphysema Mother   . Cancer Father   . Colon cancer Sister     SOCIAL HISTORY Social History   Tobacco Use  . Smoking status: Never Smoker  . Smokeless tobacco: Never Used  Substance Use Topics  . Alcohol use: No  . Drug use: No         OPHTHALMIC EXAM:  Base Eye Exam    Visual Acuity (ETDRS)      Right Left   Dist [place] 20/25 -2 20/25 +2       Tonometry (Tonopen, 1:21 PM)      Right Left   Pressure 14 15       Pupils      Pupils Dark Light Shape React APD   Right PERRL 3.5 2.5 Round Brisk None   Left PERRL 3.5 2.5 Round Brisk None  Visual Fields (Counting fingers)      Left Right    Full Full       Extraocular Movement      Right Left    Full Full       Neuro/Psych    Oriented x3: Yes   Mood/Affect: Normal       Dilation    Both eyes: 1.0% Mydriacyl, 2.5% Phenylephrine @ 1:25 PM        Slit Lamp and Fundus Exam    External Exam      Right Left   External Normal Normal       Slit Lamp Exam      Right Left   Lids/Lashes Normal Normal   Conjunctiva/Sclera White and quiet White and quiet   Cornea Clear Clear   Anterior Chamber Deep and quiet Deep and quiet   Iris Round and reactive Round and reactive   Lens Posterior chamber intraocular lens Posterior chamber intraocular lens   Anterior Vitreous Normal Normal       Fundus Exam      Right Left   Posterior Vitreous Posterior vitreous detachment    Disc Peripapillary atrophy    C/D Ratio 0.3     Macula Retinal pigment epithelial mottling, no hemorrhage, Hard drusen, pigmentary Macular atrophy, Geographic atrophy, Soft drusen    Vessels Normal    Periphery Normal           IMAGING AND PROCEDURES  Imaging and Procedures for 07/18/20  OCT, Retina - OU - Both Eyes       Right Eye Quality was good. Scan locations included subfoveal. Central Foveal Thickness: 254. Progression has improved. Findings include intraretinal fluid.   Left Eye Quality was good. Scan locations included subfoveal. Central Foveal Thickness: 246. Progression has been stable. Findings include vitreomacular adhesion .   Notes OD, with outer retinal atrophy but much less subretinal and intraretinal fluid on 12-week follow-up interval today post Avastin, will repeat Evaluation in 3 months after injection today  Small region temporal to fovea intraretinal fluid has decreased in size thus will repeat injection today and examination again in 3 months       Intravitreal Injection, Pharmacologic Agent - OD - Right Eye       Time Out 07/18/2020. 2:25 PM. Confirmed correct patient, procedure, site, and patient consented.   Anesthesia Topical anesthesia was used. Anesthetic medications included Akten 3.5%.   Procedure Preparation included 10% betadine to eyelids, 5% betadine to ocular surface, Ofloxacin . A supplied needle was used.   Injection:  2.5 mg Bevacizumab (AVASTIN) 2.5mg /0.20mL SOSY   NDC: 40086-761-95, Lot: [id number]   Route: Intravitreal, Site: Right Eye  Post-op Post injection exam found visual acuity of at least counting fingers. The patient tolerated the procedure well. There were no complications. The patient received written and verbal post procedure care education. Post injection medications were not given.                 ASSESSMENT/PLAN:  Exudative age-related macular degeneration of right eye with active choroidal neovascularization (HCC) Improved OD, with less intraretinal  fluid temporally.  At 37-month follow-up.  We will repeat injection today and examination OU in 3 months  Intermediate stage nonexudative age-related macular degeneration of left eye No signs of CNVM by examination or OCT  Cystoid macular edema of right eye Temporal CME has improved at 66-month follow-up today from CNVM and wet AMD  Vitreomacular adhesion of left eye OS physiologic, no active traction at  this time      ICD-10-CM   1. Exudative age-related macular degeneration of right eye with active choroidal neovascularization (HCC)  H35.3211 OCT, Retina - OU - Both Eyes    Intravitreal Injection, Pharmacologic Agent - OD - Right Eye    bevacizumab (AVASTIN) SOSY 2.5 mg  2. Intermediate stage nonexudative age-related macular degeneration of left eye  H35.3122 OCT, Retina - OU - Both Eyes  3. Cystoid macular edema of right eye  H35.351   4. Vitreomacular adhesion of left eye  H43.822     1.  Decreased intraretinal fluid and CME OD temporal portion of the macula post Avastin injection some 3 months previous.  Over thus repeat again today and examination again in 3 months  2.  3.  Ophthalmic Meds Ordered this visit:  Meds ordered this encounter  Medications  . bevacizumab (AVASTIN) SOSY 2.5 mg       Return in about 3 months (around 10/18/2020) for DILATE OU, AVASTIN OCT, OD.  Patient Instructions  Patient instructed to contact the office promptly for new onset visual acuity declines or distortion    Explained the diagnoses, plan, and follow up with the patient and they expressed understanding.  Patient expressed understanding of the importance of proper follow up care.   Clent Demark Rankin M.D. Diseases & Surgery of the Retina and Vitreous Retina & Diabetic [place] 07/18/20     Abbreviations: M myopia (nearsighted); A astigmatism; H hyperopia (farsighted); P presbyopia; Mrx spectacle prescription;  CTL contact lenses; OD right eye; OS left eye; OU both eyes  XT  exotropia; ET esotropia; PEK punctate epithelial keratitis; PEE punctate epithelial erosions; DES dry eye syndrome; MGD meibomian gland dysfunction; ATs artificial tears; PFAT's preservative free artificial tears; [place] nuclear sclerotic cataract; PSC posterior subcapsular cataract; ERM epi-retinal membrane; PVD posterior vitreous detachment; RD retinal detachment; DM diabetes mellitus; DR diabetic retinopathy; NPDR non-proliferative diabetic retinopathy; PDR proliferative diabetic retinopathy; CSME clinically significant macular edema; DME diabetic macular edema; dbh dot blot hemorrhages; CWS cotton wool spot; POAG primary open angle glaucoma; C/D cup-to-disc ratio; HVF humphrey visual field; GVF goldmann visual field; OCT optical coherence tomography; IOP intraocular pressure; BRVO Branch retinal vein occlusion; CRVO central retinal vein occlusion; CRAO central retinal artery occlusion; BRAO branch retinal artery occlusion; RT retinal tear; SB scleral buckle; PPV pars plana vitrectomy; VH Vitreous hemorrhage; PRP panretinal laser photocoagulation; IVK intravitreal kenalog; VMT vitreomacular traction; MH Macular hole;  NVD neovascularization of the disc; NVE neovascularization elsewhere; AREDS age related eye disease study; ARMD age related macular degeneration; POAG primary open angle glaucoma; EBMD epithelial/anterior basement membrane dystrophy; ACIOL anterior chamber intraocular lens; IOL intraocular lens; PCIOL posterior chamber intraocular lens; Phaco/IOL phacoemulsification with intraocular lens placement; [place] photorefractive keratectomy; LASIK laser assisted in situ keratomileusis; HTN hypertension; DM diabetes mellitus; COPD chronic obstructive pulmonary disease

## 2020-07-18 NOTE — Assessment & Plan Note (Signed)
No signs of CNVM by examination or OCT

## 2020-07-18 NOTE — Assessment & Plan Note (Signed)
OS physiologic, no active traction at this time

## 2020-07-18 NOTE — Patient Instructions (Signed)
Patient instructed to contact the office promptly for new onset visual acuity declines or distortion 

## 2020-07-18 NOTE — Assessment & Plan Note (Signed)
Temporal CME has improved at 21-month follow-up today from CNVM and wet AMD

## 2020-07-18 NOTE — Assessment & Plan Note (Signed)
Improved OD, with less intraretinal fluid temporally.  At 38-month follow-up.  We will repeat injection today and examination OU in 3 months

## 2020-08-08 DIAGNOSIS — H02834 Dermatochalasis of left upper eyelid: Secondary | ICD-10-CM | POA: Diagnosis not present

## 2020-08-08 DIAGNOSIS — H16143 Punctate keratitis, bilateral: Secondary | ICD-10-CM | POA: Diagnosis not present

## 2020-08-08 DIAGNOSIS — H18513 Endothelial corneal dystrophy, bilateral: Secondary | ICD-10-CM | POA: Diagnosis not present

## 2020-08-08 DIAGNOSIS — E119 Type 2 diabetes mellitus without complications: Secondary | ICD-10-CM | POA: Diagnosis not present

## 2020-08-08 DIAGNOSIS — H04123 Dry eye syndrome of bilateral lacrimal glands: Secondary | ICD-10-CM | POA: Diagnosis not present

## 2020-08-08 DIAGNOSIS — H0102B Squamous blepharitis left eye, upper and lower eyelids: Secondary | ICD-10-CM | POA: Diagnosis not present

## 2020-08-08 DIAGNOSIS — Z961 Presence of intraocular lens: Secondary | ICD-10-CM | POA: Diagnosis not present

## 2020-08-08 DIAGNOSIS — H0102A Squamous blepharitis right eye, upper and lower eyelids: Secondary | ICD-10-CM | POA: Diagnosis not present

## 2020-08-08 DIAGNOSIS — H02831 Dermatochalasis of right upper eyelid: Secondary | ICD-10-CM | POA: Diagnosis not present

## 2020-08-08 DIAGNOSIS — H353212 Exudative age-related macular degeneration, right eye, with inactive choroidal neovascularization: Secondary | ICD-10-CM | POA: Diagnosis not present

## 2020-08-08 DIAGNOSIS — H353122 Nonexudative age-related macular degeneration, left eye, intermediate dry stage: Secondary | ICD-10-CM | POA: Diagnosis not present

## 2020-08-08 DIAGNOSIS — H43393 Other vitreous opacities, bilateral: Secondary | ICD-10-CM | POA: Diagnosis not present

## 2020-09-22 ENCOUNTER — Other Ambulatory Visit: Payer: Self-pay | Admitting: Family Medicine

## 2020-09-22 DIAGNOSIS — Z1231 Encounter for screening mammogram for malignant neoplasm of breast: Secondary | ICD-10-CM

## 2020-09-25 IMAGING — MG DIGITAL SCREENING BILATERAL MAMMOGRAM WITH TOMO AND CAD
8 series · 8 of 24 positions shown · non-contrast
Comparison: Previous exam(s).

CLINICAL DATA: Screening.

EXAM:
DIGITAL SCREENING BILATERAL MAMMOGRAM WITH TOMO AND CAD

[L MLO synth-2D]
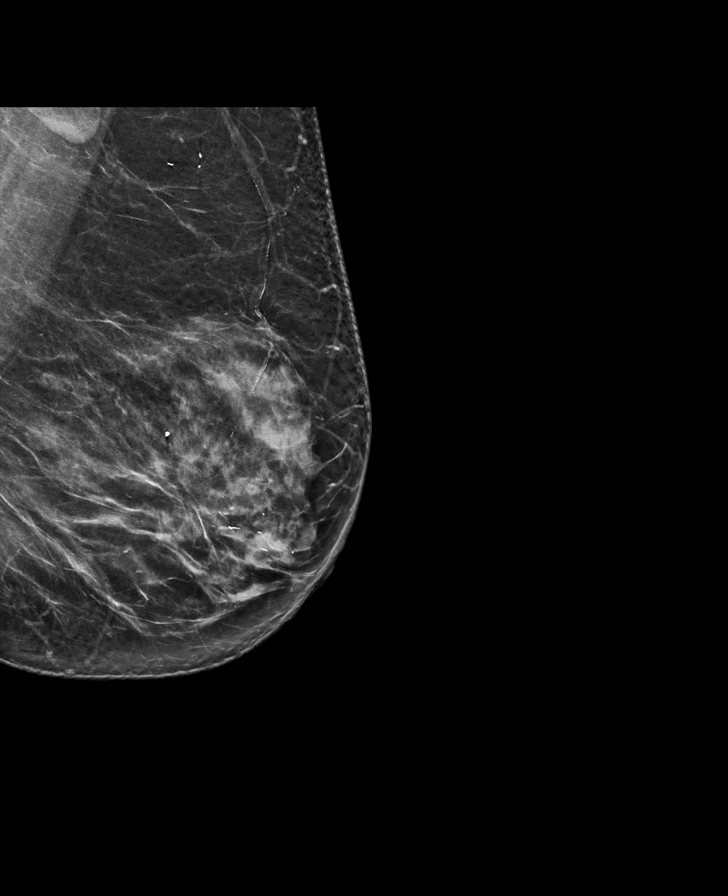

[R MLO synth-2D]
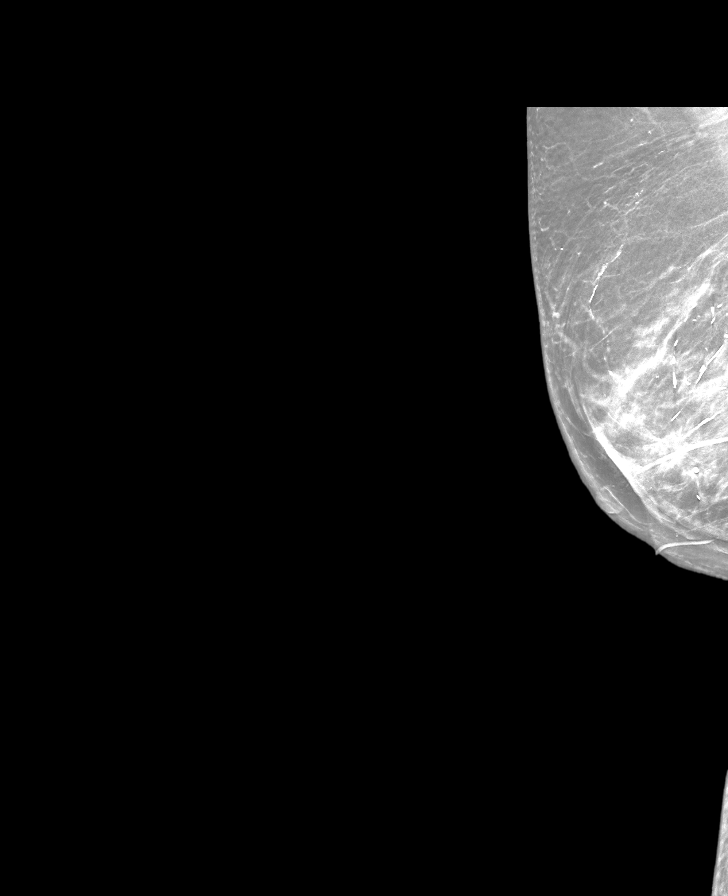

[L CC synth-2D]
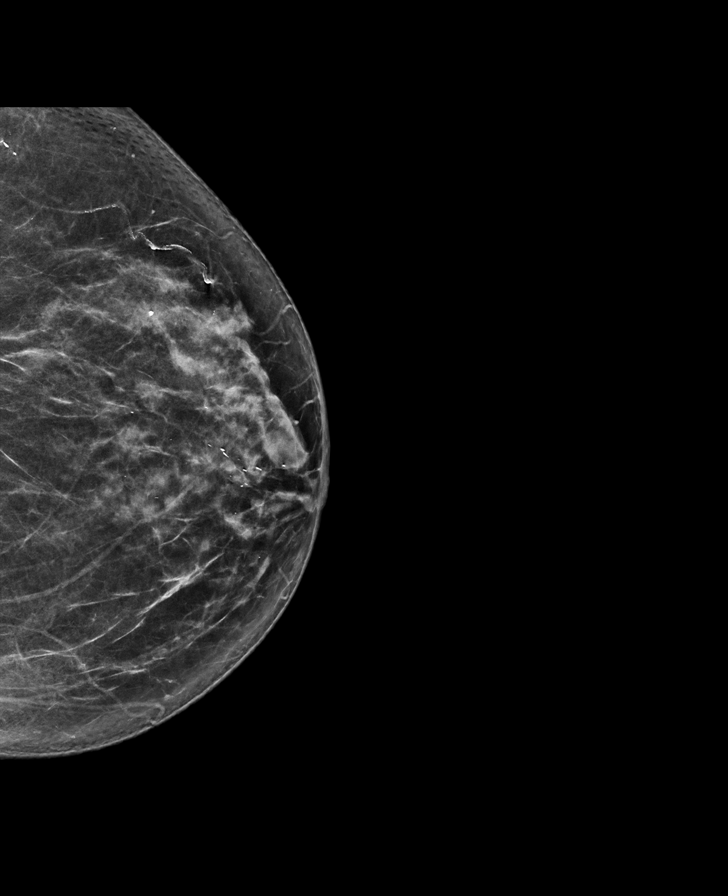

[R CC synth-2D]
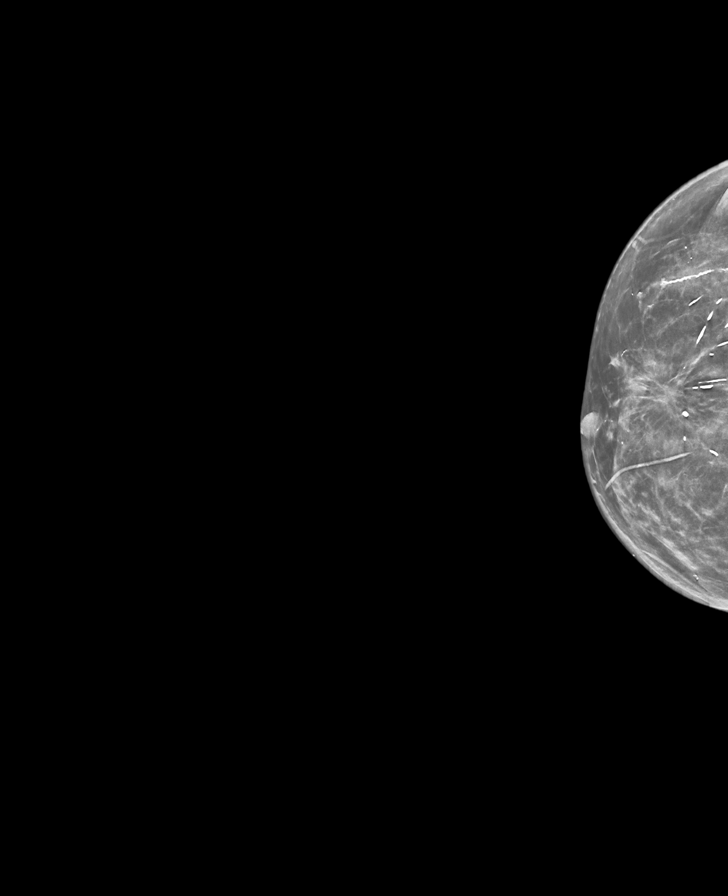

[L MLO tomo · tomo slice 38/75.0]
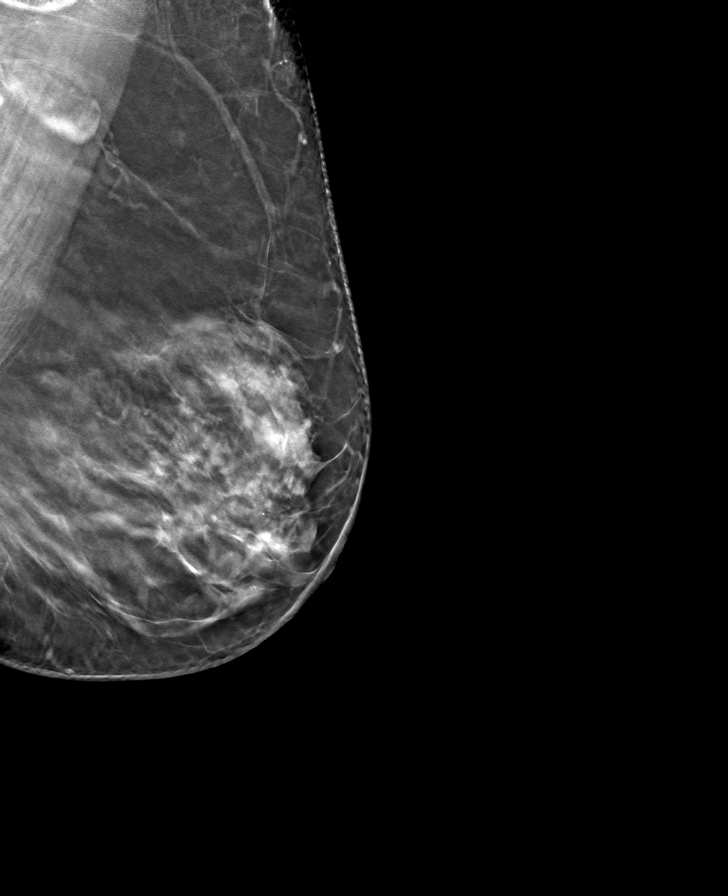

[R CC tomo · tomo slice 27/54.0]
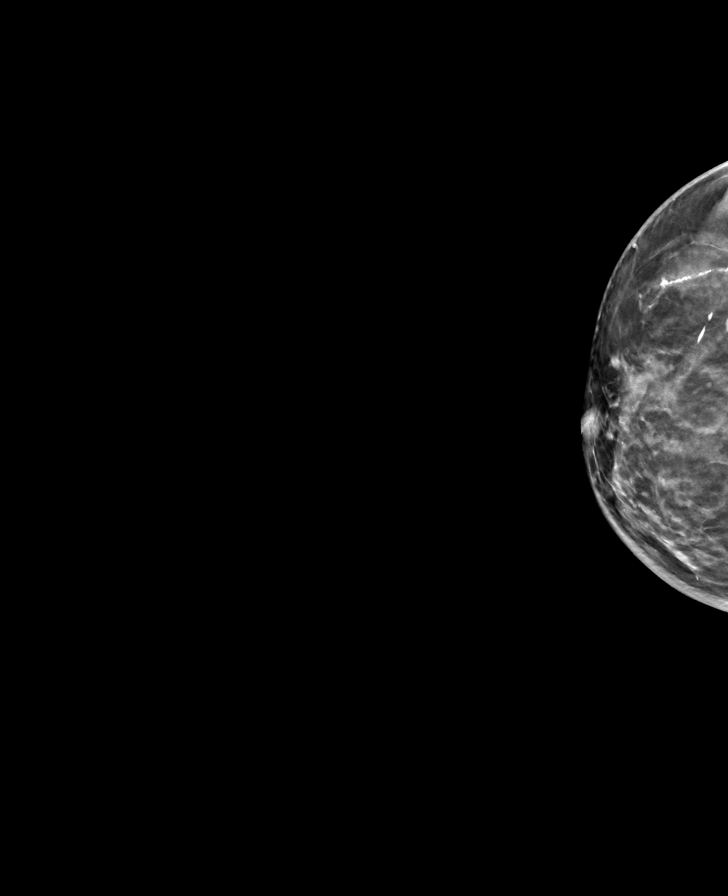

[R MLO tomo · tomo slice 33/65.0]
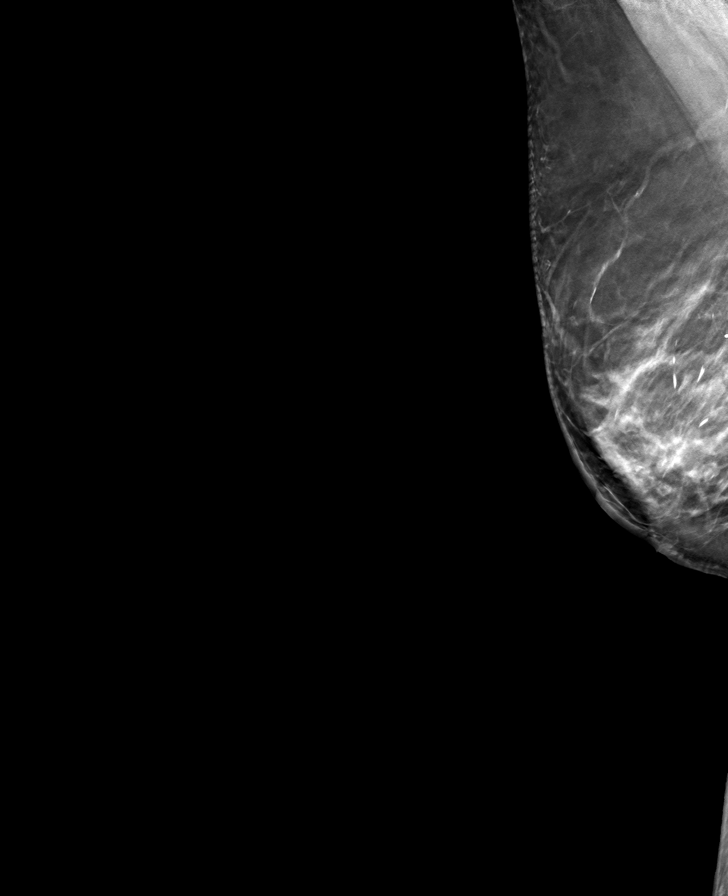

[L CC tomo · tomo slice 35/70.0]
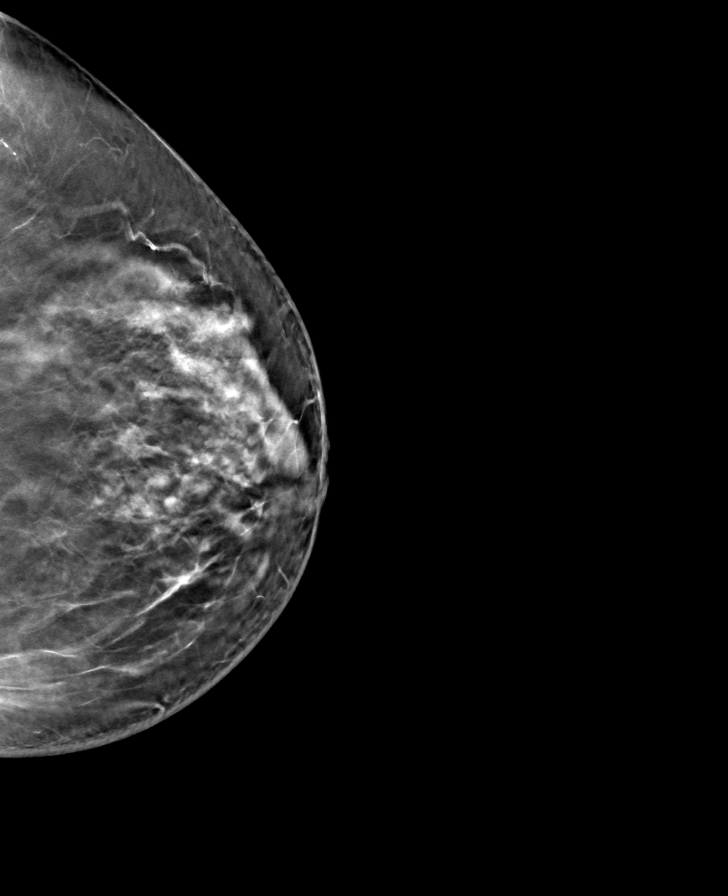

[8 of 24 positions shown; findings below may reference images not displayed]

ACR Breast Density Category c: The breast tissue is heterogeneously
dense, which may obscure small masses.
FINDINGS: In the left breast, a possible asymmetry warrants further
evaluation. In the right breast, no findings suspicious for
malignancy. Images were processed with CAD.
IMPRESSION: Further evaluation is suggested for possible asymmetry in the left
breast.

RECOMMENDATION:
Diagnostic mammogram and possibly ultrasound of the left breast.
(Code:[id number])

The patient will be contacted regarding the findings, and additional
imaging will be scheduled.

BI-RADS CATEGORY  0: Incomplete. Need additional imaging evaluation
and/or prior mammograms for comparison.

## 2020-09-26 ENCOUNTER — Ambulatory Visit
Admission: EM | Admit: 2020-09-26 | Discharge: 2020-09-26 | Disposition: A | Discharge status: Home / Self Care | Payer: PPO | Attending: Family Medicine | Admitting: Family Medicine

## 2020-09-26 ENCOUNTER — Ambulatory Visit (INDEPENDENT_AMBULATORY_CARE_PROVIDER_SITE_OTHER): Payer: PPO

## 2020-09-26 ENCOUNTER — Other Ambulatory Visit: Payer: Self-pay

## 2020-09-26 DIAGNOSIS — R059 Cough, unspecified: Secondary | ICD-10-CM

## 2020-09-26 DIAGNOSIS — Z1152 Encounter for screening for COVID-19: Secondary | ICD-10-CM | POA: Diagnosis not present

## 2020-09-26 DIAGNOSIS — R509 Fever, unspecified: Secondary | ICD-10-CM

## 2020-09-26 DIAGNOSIS — J22 Unspecified acute lower respiratory infection: Secondary | ICD-10-CM | POA: Diagnosis not present

## 2020-09-26 DIAGNOSIS — R0602 Shortness of breath: Secondary | ICD-10-CM | POA: Diagnosis not present

## 2020-09-26 MED ORDER — BENZONATATE 100 MG PO CAPS: 200.0000 mg | ORAL_CAPSULE | Freq: Three times a day (TID) | ORAL | 0 refills | Status: AC | PRN

## 2020-09-26 MED ORDER — ACETAMINOPHEN 325 MG PO TABS
650.0000 mg | ORAL_TABLET | Freq: Once | ORAL | Status: AC
  Administered 2020-09-26: 650 mg via ORAL

## 2020-09-26 MED ORDER — DOXYCYCLINE HYCLATE 100 MG PO CAPS: 100.0000 mg | ORAL_CAPSULE | Freq: Two times a day (BID) | ORAL | 0 refills | Status: AC

## 2020-09-26 NOTE — Discharge Instructions (Addendum)
Treating you today for a suspected right lobular pneumonia. COVID test is pending will result within 3 days, our office will notify you if test is postive Start Doxycyline 100 mg twice daily for 10 days. For cough benzonatate 1-2 tablets every 8 hours for cough. If you develop worsening shortness of breath or chest pain or severe tightness, go to the nearest emergency department.

## 2020-09-26 NOTE — ED Triage Notes (Signed)
Pt presents with nasal congestion and cough that began yesterday  

## 2020-09-26 NOTE — ED Provider Notes (Signed)
[facility]    CSN: [id number] Arrival date & time: 09/26/20  [place]      History   Chief Complaint Chief Complaint  Patient presents with  . Cough    HPI Ashley Huang is a 85 y.o. female.   HPI  Patient presents with URI symptoms including cough, nasal congestion, runny nose, and mild shortness of breath.  Cough is occasionally productive.  Patient denies any known exposure to anyone positive with COVID.  She is febrile on arrival today.  Oxygen level is 93%.  Denies chest pain or tightness, nausea, vomiting, or abdominal pain.   Past Medical History:  Diagnosis Date  . Breast cancer ([place]) 1989.  . DM (diabetes mellitus) ([place])    type 2-diet controlled  . Hypertension   . Personal history of radiation therapy     Patient Active Problem List   Diagnosis Date Noted  . Vitreomacular adhesion of left eye 07/18/2020  . Intermediate stage nonexudative age-related macular degeneration of left eye 04/18/2020  . Intermediate stage nonexudative age-related macular degeneration of right eye 02/08/2020  . Exudative age-related macular degeneration of right eye with active choroidal neovascularization ([place]) 09/22/2019  . Type 2 macular telangiectasis, right 09/22/2019  . Cystoid macular edema of right eye 09/22/2019  . Obstructive sleep apnea syndrome 09/22/2019  . Unspecified essential hypertension 07/17/2012  . Family hx of colon cancer 07/17/2012  . Personal history of colonic polyps 07/17/2012    Past Surgical History:  Procedure Laterality Date  . BREAST LUMPECTOMY Right   . CHOLECYSTECTOMY    . COLONOSCOPY N/A 08/08/2012   Procedure: COLONOSCOPY;  Surgeon: Rogene Houston, MD;  [facility];  Service: Endoscopy;  Laterality: N/A;  1015-moved to 9:30 Ann notified pt  . TONSILLECTOMY      OB History   No obstetric history on file.      Home Medications    Prior to Admission medications   Medication Sig Start Date End Date Taking?  Authorizing Provider  calcium carbonate (OS-CAL) 600 MG TABS Take 600 mg by mouth daily.    [provider]  cholecalciferol (VITAMIN D) 1000 UNITS tablet Take 1,000 Units by mouth daily.    [provider]  mometasone (NASONEX) 50 MCG/ACT nasal spray Place 2 sprays into the nose daily.    [provider]  montelukast (SINGULAIR) 10 MG tablet Take 10 mg by mouth as needed (Allergies).     [provider]  Multiple Vitamins-Minerals (PRESERVISION AREDS 2 PO) Take by mouth.    [provider]  lisinopril (PRINIVIL,ZESTRIL) 40 MG tablet Take 40 mg by mouth daily.  05/22/19  [provider]  omeprazole (PRILOSEC) 20 MG capsule Take 1 capsule (20 mg total) by mouth 2 (two) times daily before a meal. 11/10/12 05/22/19  Setzer, Rona Ravens, NP  pantoprazole (PROTONIX) 40 MG tablet Take 40 mg by mouth 2 (two) times daily before a meal.  05/22/19  [provider]    Family History Family History  Problem Relation Age of Onset  . Hypertension Mother   . Emphysema Mother   . Cancer Father   . Colon cancer Sister     Social History Social History   Tobacco Use  . Smoking status: Never Smoker  . Smokeless tobacco: Never Used  Substance Use Topics  . Alcohol use: No  . Drug use: No     Allergies   Antihistamines, diphenhydramine-type; Red dye; and Sulfa antibiotics   Review of Systems  Review of Systems Pertinent negatives listed in HPI   Physical Exam Triage Vital Signs ED Triage Vitals [09/26/20 1752]  Enc Vitals Group     BP 140/80     Pulse Rate 96     Resp 20     Temp 100.3 F (37.9 C)     Temp src      SpO2 93 %     Weight      Height      Head Circumference      Peak Flow      Pain Score      Pain Loc      Pain Edu?      Excl. in [place]?    No data found.  Updated Vital Signs BP 140/80   Pulse 96   Temp 100.3 F (37.9 C)   Resp 20   SpO2 93%   Visual Acuity Right Eye Distance:   Left Eye Distance:    Bilateral Distance:    Right Eye Near:   Left Eye Near:    Bilateral Near:     Physical Exam General appearance: alert, Ill-appearing, no distress Head: Normocephalic, without obvious abnormality, atraumatic ENT: External ears normal, nares mucosal edema, congestion, oropharynx w/o exudate Respiratory: Respirations even , unlabored, coarse lung sound, rales bilateral lower lobes Heart: Rate and rhythm normal. No gallop or murmurs noted on exam  Abdomen: BS +, no distention, no rebound tenderness, or no mass Extremities: No gross deformities Skin: Skin color, texture, turgor normal. No rashes seen  Psych: Appropriate mood and affect. Neurologic: GCS 15, normal coordination normal gait  UC Treatments / Results  Labs (all labs ordered are listed, but only abnormal results are displayed) Labs Reviewed  COVID-19, FLU A+B NAA    EKG   Radiology No results found.  Procedures Procedures (including critical care time)  Medications Ordered in UC Medications - No data to display  Initial Impression / Assessment and Plan / UC Course  I have reviewed the triage vital signs and the nursing notes.  Pertinent labs & imaging results that were available during my care of the patient were reviewed by me and considered in my medical decision making (see chart for details).    Imaging inconclusive for an overt pneumonia however right Heema diaphragm is slightly elevated increasing suspicion of a possible developing right lower lobe  Pneumonia. Cover with doxycycline 100 mg twice daily for 10 days.  Benzonatate for cough.  Red flag precautions given indicating symptoms which warrant immediate evaluation in setting of the emergency department.  COVID flu test is pending. Final Clinical Impressions(s) / UC Diagnoses   Final diagnoses:  Lower respiratory infection (e.g., bronchitis, pneumonia, pneumonitis, pulmonitis)  Fever, unspecified     Discharge Instructions     Treating you  today for a suspected right lobular pneumonia. COVID test is pending will result within 3 days, our office will notify you if test is postive Start Doxycyline 100 mg twice daily for 10 days. For cough benzonatate 1-2 tablets every 8 hours for cough. If you develop worsening shortness of breath or chest pain or severe tightness, go to the nearest emergency department.    ED Prescriptions    Medication Sig Dispense Auth. Provider   doxycycline (VIBRAMYCIN) 100 MG capsule Take 1 capsule (100 mg total) by mouth 2 (two) times daily. 20 capsule Scot Jun, FNP   benzonatate (TESSALON) 100 MG capsule Take 2 capsules (200 mg total) by mouth 3 (three) times daily as  needed for cough. 40 capsule Scot Jun, FNP     PDMP not reviewed this encounter.   Scot Jun, FNP 09/30/20 [phone]

## 2020-09-27 LAB — COVID-19, FLU A+B NAA
Influenza A, NAA: DETECTED — AB
Influenza B, NAA: NOT DETECTED
SARS-CoV-2, NAA: NOT DETECTED

## 2020-09-29 DIAGNOSIS — I10 Essential (primary) hypertension: Secondary | ICD-10-CM | POA: Diagnosis not present

## 2020-09-29 DIAGNOSIS — G4733 Obstructive sleep apnea (adult) (pediatric): Secondary | ICD-10-CM | POA: Diagnosis not present

## 2020-10-11 DIAGNOSIS — E559 Vitamin D deficiency, unspecified: Secondary | ICD-10-CM | POA: Diagnosis not present

## 2020-10-11 DIAGNOSIS — Z6826 Body mass index (BMI) 26.0-26.9, adult: Secondary | ICD-10-CM | POA: Diagnosis not present

## 2020-10-11 DIAGNOSIS — J189 Pneumonia, unspecified organism: Secondary | ICD-10-CM | POA: Diagnosis not present

## 2020-10-11 DIAGNOSIS — J309 Allergic rhinitis, unspecified: Secondary | ICD-10-CM | POA: Diagnosis not present

## 2020-10-11 DIAGNOSIS — E663 Overweight: Secondary | ICD-10-CM | POA: Diagnosis not present

## 2020-10-11 DIAGNOSIS — E7849 Other hyperlipidemia: Secondary | ICD-10-CM | POA: Diagnosis not present

## 2020-10-11 DIAGNOSIS — E119 Type 2 diabetes mellitus without complications: Secondary | ICD-10-CM | POA: Diagnosis not present

## 2020-10-24 ENCOUNTER — Ambulatory Visit (INDEPENDENT_AMBULATORY_CARE_PROVIDER_SITE_OTHER): Payer: PPO | Admitting: Ophthalmology

## 2020-10-24 ENCOUNTER — Other Ambulatory Visit: Payer: Self-pay

## 2020-10-24 ENCOUNTER — Encounter (INDEPENDENT_AMBULATORY_CARE_PROVIDER_SITE_OTHER): Payer: Self-pay | Admitting: Ophthalmology

## 2020-10-24 DIAGNOSIS — H43822 Vitreomacular adhesion, left eye: Secondary | ICD-10-CM | POA: Diagnosis not present

## 2020-10-24 DIAGNOSIS — H353211 Exudative age-related macular degeneration, right eye, with active choroidal neovascularization: Secondary | ICD-10-CM

## 2020-10-24 DIAGNOSIS — H35351 Cystoid macular degeneration, right eye: Secondary | ICD-10-CM | POA: Diagnosis not present

## 2020-10-24 DIAGNOSIS — H35071 Retinal telangiectasis, right eye: Secondary | ICD-10-CM

## 2020-10-24 DIAGNOSIS — G4733 Obstructive sleep apnea (adult) (pediatric): Secondary | ICD-10-CM | POA: Diagnosis not present

## 2020-10-24 DIAGNOSIS — H353122 Nonexudative age-related macular degeneration, left eye, intermediate dry stage: Secondary | ICD-10-CM

## 2020-10-24 MED ORDER — BEVACIZUMAB 2.5 MG/0.1ML IZ SOSY
2.5000 mg | PREFILLED_SYRINGE | INTRAVITREAL | Status: AC | PRN
  Administered 2020-10-24: 2.5 mg via INTRAVITREAL

## 2020-10-24 NOTE — Assessment & Plan Note (Signed)
Condition superimposed upon underlying MAC-TEL.  Now much less intraretinal fluid controlled and stabilized at 67-monthinterval.  Repeat intravitreal Avastin OD today to maintain and stabilize and evaluate again in 3 months

## 2020-10-24 NOTE — Assessment & Plan Note (Signed)
Excellent compliance on CPAP currently, having other lung issues and using systemic prednisone

## 2020-10-24 NOTE — Progress Notes (Signed)
10/24/2020     CHIEF COMPLAINT Patient presents for Retina Follow Up (3 Month F/U OU, poss Avastin OD//Pt reports continued floaters OD in the mornings upon waking. Pt denies changes to [place] OU.)   HISTORY OF PRESENT ILLNESS: Ashley Huang is a 85 y.o. female who presents to the clinic today for:   HPI     Retina Follow Up           Diagnosis: Wet AMD   Laterality: right eye   Onset: 3 months ago   Severity: mild   Duration: 3 months   Course: stable   Comments: 3 Month F/U OU, poss Avastin OD  Pt reports continued floaters OD in the mornings upon waking. Pt denies changes to [place].       Last edited by Rockie Neighbours, [place] on 10/24/2020  1:20 PM.      Referring physician: Sharilyn Sites, MD [address] [place],  [place] [address]  HISTORICAL INFORMATION:   Selected notes from the [facility]: No current outpatient medications on file. (Ophthalmic Drugs)   No current facility-administered medications for this visit. (Ophthalmic Drugs)   Current Outpatient Medications (Other)  Medication Sig   benzonatate (TESSALON) 100 MG capsule Take 2 capsules (200 mg total) by mouth 3 (three) times daily as needed for cough.   calcium carbonate (OS-CAL) 600 MG TABS Take 600 mg by mouth daily.   cholecalciferol (VITAMIN D) 1000 UNITS tablet Take 1,000 Units by mouth daily.   doxycycline (VIBRAMYCIN) 100 MG capsule Take 1 capsule (100 mg total) by mouth 2 (two) times daily.   mometasone (NASONEX) 50 MCG/ACT nasal spray Place 2 sprays into the nose daily.   montelukast (SINGULAIR) 10 MG tablet Take 10 mg by mouth as needed (Allergies).    Multiple Vitamins-Minerals (PRESERVISION AREDS 2 PO) Take by mouth.   predniSONE (DELTASONE) 10 MG tablet Take by mouth.   No current facility-administered medications for this visit. (Other)      REVIEW OF SYSTEMS:    ALLERGIES Allergies  Allergen Reactions   Antihistamines,  Diphenhydramine-Type Anaphylaxis and Swelling   Red Dye Rash   Sulfa Antibiotics Itching and Rash    PAST MEDICAL HISTORY Past Medical History:  Diagnosis Date   Breast cancer ([place]) 1989.   DM (diabetes mellitus) ([place])    type 2-diet controlled   Hypertension    Personal history of radiation therapy    Past Surgical History:  Procedure Laterality Date   BREAST LUMPECTOMY Right    CHOLECYSTECTOMY     COLONOSCOPY N/A 08/08/2012   Procedure: COLONOSCOPY;  Surgeon: Rogene Houston, MD;  [facility];  Service: Endoscopy;  Laterality: N/A;  1015-moved to 9:30 Ann notified pt   TONSILLECTOMY      FAMILY HISTORY Family History  Problem Relation Age of Onset   Hypertension Mother    Emphysema Mother    Cancer Father    Colon cancer Sister     SOCIAL HISTORY Social History   Tobacco Use   Smoking status: Never   Smokeless tobacco: Never  Substance Use Topics   Alcohol use: No   Drug use: No         OPHTHALMIC EXAM:  Base Eye Exam     Visual Acuity (ETDRS)       Right Left   Dist [place] 20/30 +1 20/30 -1   Dist ph [place] NI 20/25 -1  Tonometry (Tonopen, 1:24 PM)       Right Left   Pressure 16 18         Pupils       Pupils Dark Light Shape React APD   Right PERRL 4 3 Round Brisk None   Left PERRL 4 3 Round Brisk None         Visual Fields (Counting fingers)       Left Right    Full Full         Extraocular Movement       Right Left    Full Full         Neuro/Psych     Oriented x3: Yes   Mood/Affect: Normal         Dilation     Both eyes: 1.0% Mydriacyl, 2.5% Phenylephrine @ 1:24 PM           Slit Lamp and Fundus Exam     External Exam       Right Left   External Normal Normal         Slit Lamp Exam       Right Left   Lids/Lashes Normal Normal   Conjunctiva/Sclera White and quiet White and quiet   Cornea Clear Clear   Anterior Chamber Deep and quiet Deep and quiet   Iris Round and reactive  Round and reactive   Lens Posterior chamber intraocular lens Posterior chamber intraocular lens   Anterior Vitreous Normal Normal         Fundus Exam       Right Left   Posterior Vitreous Posterior vitreous detachment Normal   Disc Peripapillary atrophy Normal   C/D Ratio 0.3 0.5   Macula Retinal pigment epithelial mottling, no hemorrhage, Hard drusen, pigmentary Macular atrophy, Geographic atrophy, Soft drusen Hard drusen, no macular thickening, no hemorrhage   Vessels Normal Normal   Periphery Normal Normal            IMAGING AND PROCEDURES  Imaging and Procedures for 10/24/20  OCT, Retina - OU - Both Eyes       Right Eye Quality was good. Scan locations included subfoveal. Central Foveal Thickness: 253. Progression has been stable. Findings include intraretinal fluid, retinal drusen , subretinal hyper-reflective material.   Left Eye Quality was good. Scan locations included subfoveal. Central Foveal Thickness: 244. Progression has been stable. Findings include vitreomacular adhesion , retinal drusen .   Notes OD, with outer retinal atrophy but much less subretinal and intraretinal fluid on 12-week follow-up interval today post Avastin, will repeat Evaluation in 3 months after injection today  Small region temporal to fovea intraretinal fluid has decreased in size thus will repeat injection today and examination again in 3 months     Intravitreal Injection, Pharmacologic Agent - OD - Right Eye       Time Out 10/24/2020. 2:09 PM. Confirmed correct patient, procedure, site, and patient consented.   Anesthesia Topical anesthesia was used. Anesthetic medications included [place] 3.5%.   Procedure Preparation included 10% betadine to eyelids, 5% betadine to ocular surface, Ofloxacin . A supplied needle was used.   Injection: 2.5 mg bevacizumab 2.5 MG/0.1ML   Route: Intravitreal   NDC: 94076-808-81   Post-op Post injection exam found visual acuity of at least  counting fingers. The patient tolerated the procedure well. There were no complications. The patient received written and verbal post procedure care education. Post injection medications were not given.  ASSESSMENT/PLAN:  Cystoid macular edema of right eye Improved temporally, to the fovea, and controlled OD  to intravitreal Avastin thus we will repeat today as a component of treatment of wet AMD  Exudative age-related macular degeneration of right eye with active choroidal neovascularization ([place]) Condition superimposed upon underlying MAC-TEL.  Now much less intraretinal fluid controlled and stabilized at 33-monthinterval.  Repeat intravitreal Avastin OD today to maintain and stabilize and evaluate again in 3 months  Type 2 macular telangiectasis, right Patient continues with excellent compliance on CPAP  Vitreomacular adhesion of left eye Physiologic stable  Obstructive sleep apnea syndrome Excellent compliance on CPAP currently, having other lung issues and using systemic prednisone  Intermediate stage nonexudative age-related macular degeneration of left eye No sign of CNVM OS     ICD-10-CM   1. Exudative age-related macular degeneration of right eye with active choroidal neovascularization (HCC)  H35.3211 OCT, Retina - OU - Both Eyes    Intravitreal Injection, Pharmacologic Agent - OD - Right Eye    bevacizumab (AVASTIN) SOSY 2.5 mg    2. Cystoid macular edema of right eye  H35.351     3. Type 2 macular telangiectasis, right  H35.071     4. Vitreomacular adhesion of left eye  H43.822     5. Obstructive sleep apnea syndrome  G47.33     6. Intermediate stage nonexudative age-related macular degeneration of left eye  H35.3122       1.  History of MAC-TEL, and stable minor CME made worse however by element of wet AMD OD.  Wet AMD has responded in the past with improved OCT findings to antivegF therapy.  We will repeat injection today to maintain.    3  month interval examination OD.  2.  OS no sign of CNVM  3.  Patient continues on CPAP use to maximize nightly macular oxygenation  Ophthalmic Meds Ordered this visit:  Meds ordered this encounter  Medications   bevacizumab (AVASTIN) SOSY 2.5 mg       Return in about 3 months (around 01/24/2021) for dilate, OD, AVASTIN OCT.  There are no Patient Instructions on file for this visit.   Explained the diagnoses, plan, and follow up with the patient and they expressed understanding.  Patient expressed understanding of the importance of proper follow up care.   GClent DemarkRankin M.D. Diseases & Surgery of the Retina and Vitreous Retina & Diabetic EW[place]06/13/22     Abbreviations: M myopia (nearsighted); A astigmatism; H hyperopia (farsighted); P presbyopia; Mrx spectacle prescription;  CTL contact lenses; OD right eye; OS left eye; OU both eyes  XT exotropia; ET esotropia; PEK punctate epithelial keratitis; PEE punctate epithelial erosions; DES dry eye syndrome; MGD meibomian gland dysfunction; ATs artificial tears; PFAT's preservative free artificial tears; NBuffalo Grovenuclear sclerotic cataract; PSC posterior subcapsular cataract; ERM epi-retinal membrane; PVD posterior vitreous detachment; RD retinal detachment; DM diabetes mellitus; DR diabetic retinopathy; NPDR non-proliferative diabetic retinopathy; PDR proliferative diabetic retinopathy; CSME clinically significant macular edema; DME diabetic macular edema; dbh dot blot hemorrhages; CWS cotton wool spot; POAG primary open angle glaucoma; C/D cup-to-disc ratio; HVF humphrey visual field; GVF goldmann visual field; OCT optical coherence tomography; IOP intraocular pressure; BRVO Branch retinal vein occlusion; CRVO central retinal vein occlusion; CRAO central retinal artery occlusion; BRAO branch retinal artery occlusion; RT retinal tear; SB scleral buckle; PPV pars plana vitrectomy; VH Vitreous hemorrhage; PRP panretinal laser photocoagulation;  IVK intravitreal kenalog; VMT vitreomacular traction; MH Macular hole;  NVD  neovascularization of the disc; NVE neovascularization elsewhere; AREDS age related eye disease study; ARMD age related macular degeneration; POAG primary open angle glaucoma; EBMD epithelial/anterior basement membrane dystrophy; ACIOL anterior chamber intraocular lens; IOL intraocular lens; PCIOL posterior chamber intraocular lens; Phaco/IOL phacoemulsification with intraocular lens placement; [place] photorefractive keratectomy; LASIK laser assisted in situ keratomileusis; HTN hypertension; DM diabetes mellitus; COPD chronic obstructive pulmonary disease

## 2020-10-24 NOTE — Assessment & Plan Note (Signed)
Improved temporally, to the fovea, and controlled OD  to intravitreal Avastin thus we will repeat today as a component of treatment of wet AMD

## 2020-10-24 NOTE — Assessment & Plan Note (Signed)
Physiologic stable 

## 2020-10-24 NOTE — Assessment & Plan Note (Signed)
No sign of CNVM OS 

## 2020-10-24 NOTE — Assessment & Plan Note (Signed)
Patient continues with excellent compliance on CPAP

## 2020-11-17 ENCOUNTER — Other Ambulatory Visit: Payer: Self-pay

## 2020-11-17 ENCOUNTER — Ambulatory Visit
Admission: RE | Admit: 2020-11-17 | Discharge: 2020-11-17 | Disposition: A | Discharge status: Home / Self Care | Payer: PPO | Source: Ambulatory Visit | Attending: Family Medicine | Admitting: Family Medicine

## 2020-11-17 DIAGNOSIS — Z1231 Encounter for screening mammogram for malignant neoplasm of breast: Secondary | ICD-10-CM | POA: Diagnosis not present

## 2021-01-23 DIAGNOSIS — Z6826 Body mass index (BMI) 26.0-26.9, adult: Secondary | ICD-10-CM | POA: Diagnosis not present

## 2021-01-23 DIAGNOSIS — E119 Type 2 diabetes mellitus without complications: Secondary | ICD-10-CM | POA: Diagnosis not present

## 2021-01-23 DIAGNOSIS — E782 Mixed hyperlipidemia: Secondary | ICD-10-CM | POA: Diagnosis not present

## 2021-01-23 DIAGNOSIS — Z1331 Encounter for screening for depression: Secondary | ICD-10-CM | POA: Diagnosis not present

## 2021-01-23 DIAGNOSIS — Z0001 Encounter for general adult medical examination with abnormal findings: Secondary | ICD-10-CM | POA: Diagnosis not present

## 2021-01-23 DIAGNOSIS — K219 Gastro-esophageal reflux disease without esophagitis: Secondary | ICD-10-CM | POA: Diagnosis not present

## 2021-01-23 DIAGNOSIS — E559 Vitamin D deficiency, unspecified: Secondary | ICD-10-CM | POA: Diagnosis not present

## 2021-01-23 DIAGNOSIS — H3530 Unspecified macular degeneration: Secondary | ICD-10-CM | POA: Diagnosis not present

## 2021-01-23 DIAGNOSIS — E663 Overweight: Secondary | ICD-10-CM | POA: Diagnosis not present

## 2021-01-30 ENCOUNTER — Encounter (INDEPENDENT_AMBULATORY_CARE_PROVIDER_SITE_OTHER): Payer: Self-pay | Admitting: Ophthalmology

## 2021-01-30 ENCOUNTER — Ambulatory Visit (INDEPENDENT_AMBULATORY_CARE_PROVIDER_SITE_OTHER): Payer: PPO | Admitting: Ophthalmology

## 2021-01-30 ENCOUNTER — Other Ambulatory Visit: Payer: Self-pay

## 2021-01-30 DIAGNOSIS — H353211 Exudative age-related macular degeneration, right eye, with active choroidal neovascularization: Secondary | ICD-10-CM

## 2021-01-30 DIAGNOSIS — G4733 Obstructive sleep apnea (adult) (pediatric): Secondary | ICD-10-CM | POA: Diagnosis not present

## 2021-01-30 DIAGNOSIS — H353122 Nonexudative age-related macular degeneration, left eye, intermediate dry stage: Secondary | ICD-10-CM

## 2021-01-30 MED ORDER — BEVACIZUMAB 2.5 MG/0.1ML IZ SOSY
2.5000 mg | PREFILLED_SYRINGE | INTRAVITREAL | Status: AC | PRN
  Administered 2021-01-30: 2.5 mg via INTRAVITREAL

## 2021-01-30 NOTE — Assessment & Plan Note (Signed)
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  Now however may need to dosing has been obtained again that 9-monthinterval.  Small area of intraretinal fluid on the temporal aspect of the fovea suggest ongoing therapy required to maintain good acuity  Repeat Avastin OD today, and examination OU in  3 months

## 2021-01-30 NOTE — Assessment & Plan Note (Signed)
OS stable no signs of CNVM formation

## 2021-01-30 NOTE — Assessment & Plan Note (Signed)
Patient reports excellent compliance with CPAP use

## 2021-01-30 NOTE — Progress Notes (Signed)
01/30/2021     CHIEF COMPLAINT Patient presents for  Chief Complaint  Patient presents with   Retina Follow Up      HISTORY OF PRESENT ILLNESS: Ashley Huang is a 85 y.o. female who presents to the clinic today for:   HPI     Retina Follow Up   Patient presents with  Wet AMD.  In right eye.  This started 3 months ago.  Duration of 3 months.        Comments   3 month f/u OD with OCT and possible Avastin injection  Pt denies any visual changes since previous visit. Pt c/o seeing some small floaters, believes they are both eyes, present, "all summer." Pt denies any new flashes of light. Pt denies any eye pain.   Type 2 diabetic. Diagnosed for ~ 40 years. A1c: 5.1 (1 week ago)  Eye Meds: AREDS2 BID      Last edited by Reather Littler, COA on 01/30/2021 12:49 PM.      Referring physician: Sharilyn Sites, MD [address] [place],  [place] [address]  HISTORICAL INFORMATION:   Selected notes from the [facility]: No current outpatient medications on file. (Ophthalmic Drugs)   No current facility-administered medications for this visit. (Ophthalmic Drugs)   Current Outpatient Medications (Other)  Medication Sig   benzonatate (TESSALON) 100 MG capsule Take 2 capsules (200 mg total) by mouth 3 (three) times daily as needed for cough.   calcium carbonate (OS-CAL) 600 MG TABS Take 600 mg by mouth daily.   cholecalciferol (VITAMIN D) 1000 UNITS tablet Take 1,000 Units by mouth daily.   doxycycline (VIBRAMYCIN) 100 MG capsule Take 1 capsule (100 mg total) by mouth 2 (two) times daily.   mometasone (NASONEX) 50 MCG/ACT nasal spray Place 2 sprays into the nose daily.   montelukast (SINGULAIR) 10 MG tablet Take 10 mg by mouth as needed (Allergies).    Multiple Vitamins-Minerals (PRESERVISION AREDS 2 PO) Take by mouth.   predniSONE (DELTASONE) 10 MG tablet Take by mouth.   No current facility-administered medications for  this visit. (Other)      REVIEW OF SYSTEMS:    ALLERGIES Allergies  Allergen Reactions   Antihistamines, Diphenhydramine-Type Anaphylaxis and Swelling   Red Dye Rash   Sulfa Antibiotics Itching and Rash    PAST MEDICAL HISTORY Past Medical History:  Diagnosis Date   Breast cancer ([facility]) 1989.   DM (diabetes mellitus) ([facility])    type 2-diet controlled   Hypertension    Personal history of radiation therapy    Past Surgical History:  Procedure Laterality Date   BREAST LUMPECTOMY Right    CHOLECYSTECTOMY     COLONOSCOPY N/A 08/08/2012   Procedure: COLONOSCOPY;  Surgeon: Rogene Houston, MD;  [facility]: [facility];  Service: Endoscopy;  Laterality: N/A;  1015-moved to 9:30 Ann notified pt   TONSILLECTOMY      FAMILY HISTORY Family History  Problem Relation Age of Onset   Hypertension Mother    Emphysema Mother    Cancer Father    Colon cancer Sister     SOCIAL HISTORY Social History   Tobacco Use   Smoking status: Never   Smokeless tobacco: Never  Substance Use Topics   Alcohol use: No   Drug use: No         OPHTHALMIC EXAM:  Base Eye Exam     Visual Acuity (ETDRS)  Right Left   Dist [place] 20/40 +1 20/25 -2   Dist ph [place] 20/40 +2          Tonometry (Tonopen, 12:55 PM)       Right Left   Pressure 13 13         Pupils       Pupils Dark Light Shape React APD   Right PERRL 4 3 Round Brisk None   Left PERRL 4 3 Round Brisk None         Visual Fields       Left Right    Full Full         Extraocular Movement       Right Left    Full, Ortho Full, Ortho         Neuro/Psych     Oriented x3: Yes   Mood/Affect: Normal         Dilation     Right eye: 1.0% Mydriacyl, 2.5% Phenylephrine @ 12:55 PM           Slit Lamp and Fundus Exam     External Exam       Right Left   External Normal Normal         Slit Lamp Exam       Right Left   Lids/Lashes Normal Normal   Conjunctiva/Sclera White and quiet White  and quiet   Cornea Clear Clear   Anterior Chamber Deep and quiet Deep and quiet   Iris Round and reactive Round and reactive   Lens Posterior chamber intraocular lens Posterior chamber intraocular lens   Anterior Vitreous Normal Normal         Fundus Exam       Right Left   Posterior Vitreous Posterior vitreous detachment    Disc Peripapillary atrophy    C/D Ratio 0.3    Macula Retinal pigment epithelial mottling, no hemorrhage, Hard drusen, pigmentary Macular atrophy, Geographic atrophy, Soft drusen    Vessels Normal    Periphery Normal             IMAGING AND PROCEDURES  Imaging and Procedures for 01/30/21  OCT, Retina - OU - Both Eyes       Right Eye Quality was good. Scan locations included subfoveal. Central Foveal Thickness: 321. Progression has been stable. Findings include intraretinal fluid, retinal drusen , subretinal hyper-reflective material.   Left Eye Quality was good. Scan locations included subfoveal. Central Foveal Thickness: 249. Progression has been stable. Findings include vitreomacular adhesion , retinal drusen .   Notes OD, with outer retinal atrophy but much less subretinal and intraretinal fluid on 3 months follow-up interval today post Avastin, will repeat Evaluation in 3 months after injection today  Small region temporal to fovea intraretinal fluid has decreased in size thus will repeat injection today and examination again in 3 months     Intravitreal Injection, Pharmacologic Agent - OD - Right Eye       Time Out 01/30/2021. 1:20 PM. Confirmed correct patient, procedure, site, and patient consented.   Anesthesia Topical anesthesia was used. Anesthetic medications included [place] 3.5%.   Procedure Preparation included 10% betadine to eyelids, 5% betadine to ocular surface, Ofloxacin . A supplied needle was used.   Injection: 2.5 mg bevacizumab 2.5 MG/0.1ML   Route: Intravitreal, Site: Right Eye   NDC: [phone], Lot: [id number]    Post-op Post injection exam found visual acuity of at least counting fingers. The patient tolerated the procedure well. There were  no complications. The patient received written and verbal post procedure care education. Post injection medications included ocuflox.              ASSESSMENT/PLAN:  Exudative age-related macular degeneration of right eye with active choroidal neovascularization ([facility]) History of recurrence of CNVM in the past upon cessation of therapy.  Now however may need to dosing has been obtained again that 35-month interval.  Small area of intraretinal fluid on the temporal aspect of the fovea suggest ongoing therapy required to maintain good acuity  Repeat Avastin OD today, and examination OU in  3 months  Intermediate stage nonexudative age-related macular degeneration of left eye OS stable no signs of CNVM formation  Obstructive sleep apnea syndrome Patient reports excellent compliance with CPAP use     ICD-10-CM   1. Exudative age-related macular degeneration of right eye with active choroidal neovascularization (HCC)  H35.3211 OCT, Retina - OU - Both Eyes    Intravitreal Injection, Pharmacologic Agent - OD - Right Eye    bevacizumab (AVASTIN) SOSY 2.5 mg    2. Intermediate stage nonexudative age-related macular degeneration of left eye  H35.3122     3. Obstructive sleep apnea syndrome  G47.33       1.  OD stabilized wet AMD at 37-month interval.  Nonetheless chronic disease activity with history of recurrences.  Good acuity preserved.  3 months postinjection Avastin repeat today and examination again in 3 months  2.  Dilate OU next  3.  Ophthalmic Meds Ordered this visit:  Meds ordered this encounter  Medications   bevacizumab (AVASTIN) SOSY 2.5 mg       Return in about 3 months (around 05/01/2021) for DILATE OU, AVASTIN OCT, OD.  There are no Patient Instructions on file for this visit.   Explained the diagnoses, plan, and follow up with  the patient and they expressed understanding.  Patient expressed understanding of the importance of proper follow up care.   Clent Demark Celene Pippins M.D. Diseases & Surgery of the Retina and Vitreous Retina & Diabetic [place] 01/30/21     Abbreviations: M myopia (nearsighted); A astigmatism; H hyperopia (farsighted); P presbyopia; Mrx spectacle prescription;  CTL contact lenses; OD right eye; OS left eye; OU both eyes  XT exotropia; ET esotropia; PEK punctate epithelial keratitis; PEE punctate epithelial erosions; DES dry eye syndrome; MGD meibomian gland dysfunction; ATs artificial tears; PFAT's preservative free artificial tears; [place] nuclear sclerotic cataract; PSC posterior subcapsular cataract; ERM epi-retinal membrane; PVD posterior vitreous detachment; RD retinal detachment; DM diabetes mellitus; DR diabetic retinopathy; NPDR non-proliferative diabetic retinopathy; PDR proliferative diabetic retinopathy; CSME clinically significant macular edema; DME diabetic macular edema; dbh dot blot hemorrhages; CWS cotton wool spot; POAG primary open angle glaucoma; C/D cup-to-disc ratio; HVF humphrey visual field; GVF goldmann visual field; OCT optical coherence tomography; IOP intraocular pressure; BRVO Branch retinal vein occlusion; CRVO central retinal vein occlusion; CRAO central retinal artery occlusion; BRAO branch retinal artery occlusion; RT retinal tear; SB scleral buckle; PPV pars plana vitrectomy; VH Vitreous hemorrhage; PRP panretinal laser photocoagulation; IVK intravitreal kenalog; VMT vitreomacular traction; MH Macular hole;  NVD neovascularization of the disc; NVE neovascularization elsewhere; AREDS age related eye disease study; ARMD age related macular degeneration; POAG primary open angle glaucoma; EBMD epithelial/anterior basement membrane dystrophy; ACIOL anterior chamber intraocular lens; IOL intraocular lens; PCIOL posterior chamber intraocular lens; Phaco/IOL phacoemulsification with  intraocular lens placement; [place] photorefractive keratectomy; LASIK laser assisted in situ keratomileusis; HTN hypertension; DM diabetes mellitus; COPD chronic obstructive  pulmonary disease

## 2021-02-28 DIAGNOSIS — Z23 Encounter for immunization: Secondary | ICD-10-CM | POA: Diagnosis not present

## 2021-05-01 ENCOUNTER — Ambulatory Visit (INDEPENDENT_AMBULATORY_CARE_PROVIDER_SITE_OTHER): Payer: PPO | Admitting: Ophthalmology

## 2021-05-01 ENCOUNTER — Encounter (INDEPENDENT_AMBULATORY_CARE_PROVIDER_SITE_OTHER): Payer: Self-pay | Admitting: Ophthalmology

## 2021-05-01 ENCOUNTER — Other Ambulatory Visit: Payer: Self-pay

## 2021-05-01 DIAGNOSIS — H353211 Exudative age-related macular degeneration, right eye, with active choroidal neovascularization: Secondary | ICD-10-CM | POA: Diagnosis not present

## 2021-05-01 DIAGNOSIS — H353122 Nonexudative age-related macular degeneration, left eye, intermediate dry stage: Secondary | ICD-10-CM

## 2021-05-01 MED ORDER — BEVACIZUMAB 2.5 MG/0.1ML IZ SOSY
2.5000 mg | PREFILLED_SYRINGE | INTRAVITREAL | Status: AC | PRN
  Administered 2021-05-01: 14:00:00 2.5 mg via INTRAVITREAL

## 2021-05-01 NOTE — Assessment & Plan Note (Signed)
No sign of CNVM OS 

## 2021-05-01 NOTE — Progress Notes (Signed)
05/01/2021     CHIEF COMPLAINT Patient presents for  Chief Complaint  Patient presents with   Retina Follow Up      HISTORY OF PRESENT ILLNESS: Ashley Huang is a 85 y.o. female who presents to the clinic today for:   HPI     Retina Follow Up           Diagnosis: Wet AMD   Laterality: right eye   Onset: 3 months ago   Severity: mild   Duration: 3 months   Course: gradually worsening         Comments   3 month fu OU and OCT/ Avastin OD- Exudative ARMD right eye  Pt states, "I feel like things are a little more dark on my right eye especially. When I am driving and I turn my head my right eye gets very distorted. I feel like I have a skim over my eyes."  Pt continues to take AREDS  Denies any new FOL or floaters  Pt states that she off of Metformin and had A1C of 5.9      Last edited by Kendra Opitz, COA on 05/01/2021 12:54 PM.      Referring physician: Clent Jacks, MD [address] [place],  [place] [address]  HISTORICAL INFORMATION:   Selected notes from the [facility]: No current outpatient medications on file. (Ophthalmic Drugs)   No current facility-administered medications for this visit. (Ophthalmic Drugs)   Current Outpatient Medications (Other)  Medication Sig   benzonatate (TESSALON) 100 MG capsule Take 2 capsules (200 mg total) by mouth 3 (three) times daily as needed for cough.   calcium carbonate (OS-CAL) 600 MG TABS Take 600 mg by mouth daily.   cholecalciferol (VITAMIN D) 1000 UNITS tablet Take 1,000 Units by mouth daily.   doxycycline (VIBRAMYCIN) 100 MG capsule Take 1 capsule (100 mg total) by mouth 2 (two) times daily.   mometasone (NASONEX) 50 MCG/ACT nasal spray Place 2 sprays into the nose daily.   montelukast (SINGULAIR) 10 MG tablet Take 10 mg by mouth as needed (Allergies).    Multiple Vitamins-Minerals (PRESERVISION AREDS 2 PO) Take by mouth.   predniSONE (DELTASONE) 10  MG tablet Take by mouth.   No current facility-administered medications for this visit. (Other)      REVIEW OF SYSTEMS:    ALLERGIES Allergies  Allergen Reactions   Antihistamines, Diphenhydramine-Type Anaphylaxis and Swelling   Red Dye Rash   Sulfa Antibiotics Itching and Rash    PAST MEDICAL HISTORY Past Medical History:  Diagnosis Date   Breast cancer ([facility]) 1989.   DM (diabetes mellitus) ([facility])    type 2-diet controlled   Hypertension    Personal history of radiation therapy    Past Surgical History:  Procedure Laterality Date   BREAST LUMPECTOMY Right    CHOLECYSTECTOMY     COLONOSCOPY N/A 08/08/2012   Procedure: COLONOSCOPY;  Surgeon: Rogene Houston, MD;  [facility];  Service: Endoscopy;  Laterality: N/A;  1015-moved to 9:30 Ann notified pt   TONSILLECTOMY      FAMILY HISTORY Family History  Problem Relation Age of Onset   Hypertension Mother    Emphysema Mother    Cancer Father    Colon cancer Sister     SOCIAL HISTORY Social History   Tobacco Use   Smoking status: Never   Smokeless tobacco: Never  Substance Use Topics  Alcohol use: No   Drug use: No         OPHTHALMIC EXAM:  Base Eye Exam     Visual Acuity (ETDRS)       Right Left   Dist [place] 20/80 -2 20/25 +2   Dist ph [place] NI          Tonometry (Tonopen, 12:57 PM)       Right Left   Pressure 14 13         Pupils       Pupils Shape React APD   Right PERRL Round Brisk None   Left PERRL Round Brisk None         Visual Fields (Counting fingers)       Left Right    Full Full         Extraocular Movement       Right Left    Full Full         Neuro/Psych     Oriented x3: Yes   Mood/Affect: Normal         Dilation     Both eyes: 1.0% Mydriacyl, 2.5% Phenylephrine @ 12:58 PM           Slit Lamp and Fundus Exam     External Exam       Right Left   External Normal Normal         Slit Lamp Exam       Right Left   Lids/Lashes  Normal Normal   Conjunctiva/Sclera White and quiet White and quiet   Cornea Clear Clear   Anterior Chamber Deep and quiet Deep and quiet   Iris Round and reactive Round and reactive   Lens Posterior chamber intraocular lens Posterior chamber intraocular lens   Anterior Vitreous Normal Normal         Fundus Exam       Right Left   Posterior Vitreous Posterior vitreous detachment Normal   Disc Peripapillary atrophy Normal   C/D Ratio 0.3 0.5   Macula Retinal pigment epithelial mottling, no hemorrhage, Hard drusen, pigmentary Macular atrophy, Geographic atrophy, Soft drusen Hard drusen, no macular thickening, no hemorrhage   Vessels Normal Normal   Periphery Normal Normal            IMAGING AND PROCEDURES  Imaging and Procedures for 05/01/21  OCT, Retina - OU - Both Eyes       Right Eye Quality was good. Scan locations included subfoveal. Central Foveal Thickness: 370. Progression has been stable. Findings include intraretinal fluid, retinal drusen , subretinal hyper-reflective material, subretinal fluid.   Left Eye Quality was good. Scan locations included subfoveal. Central Foveal Thickness: 247. Progression has been stable. Findings include vitreomacular adhesion , retinal drusen .   Notes OD, with outer retinal atrophy but much increased subretinal and intraretinal fluid on 3 months follow-up interval today post Avastin, will repeat injection OD today and follow-up again in 6 to 7 weeks        Intravitreal Injection, Pharmacologic Agent - OD - Right Eye       Time Out 05/01/2021. 1:35 PM. Confirmed correct patient, procedure, site, and patient consented.   Anesthesia Topical anesthesia was used. Anesthetic medications included Lidocaine 4%.   Procedure Preparation included 10% betadine to eyelids, 5% betadine to ocular surface, Ofloxacin . A supplied needle was used.   Injection: 2.5 mg bevacizumab 2.5 MG/0.1ML   Route: Intravitreal, Site: Right Eye    NDC: [phone], Lot: [id number]  Post-op Post injection exam found visual acuity of at least counting fingers. The patient tolerated the procedure well. There were no complications. The patient received written and verbal post procedure care education. Post injection medications included ocuflox.              ASSESSMENT/PLAN:  Exudative age-related macular degeneration of right eye with active choroidal neovascularization ([facility]) Today at 71-month follow-up again, with increased subretinal fluid and subretinal hyper reflective material foveal and inferiorly.  Repeat injection today Avastin and follow-up in 6 to 7 weeks OD to regain control of wet AMD still active  Intermediate stage nonexudative age-related macular degeneration of left eye No sign of CNVM OS     ICD-10-CM   1. Exudative age-related macular degeneration of right eye with active choroidal neovascularization (HCC)  H35.3211 OCT, Retina - OU - Both Eyes    Intravitreal Injection, Pharmacologic Agent - OD - Right Eye    bevacizumab (AVASTIN) SOSY 2.5 mg    2. Intermediate stage nonexudative age-related macular degeneration of left eye  H35.3122       1.  OD, new recurrence of CNVM subfoveal at 80-month follow-up today post Avastin.  We will need to repeat injection today and follow-up in a shorter interval of 6 weeks and to regain control of disease process OD  2.  No sign of CNVM OS  3.  Ophthalmic Meds Ordered this visit:  Meds ordered this encounter  Medications   bevacizumab (AVASTIN) SOSY 2.5 mg       Return in about 6 weeks (around 06/12/2021) for dilate, OD, AVASTIN OCT.  There are no Patient Instructions on file for this visit.   Explained the diagnoses, plan, and follow up with the patient and they expressed understanding.  Patient expressed understanding of the importance of proper follow up care.   Clent Demark Marvon Shillingburg M.D. Diseases & Surgery of the Retina and Vitreous Retina & Diabetic [place] 05/01/21     Abbreviations: M myopia (nearsighted); A astigmatism; H hyperopia (farsighted); P presbyopia; Mrx spectacle prescription;  CTL contact lenses; OD right eye; OS left eye; OU both eyes  XT exotropia; ET esotropia; PEK punctate epithelial keratitis; PEE punctate epithelial erosions; DES dry eye syndrome; MGD meibomian gland dysfunction; ATs artificial tears; PFAT's preservative free artificial tears; [place] nuclear sclerotic cataract; PSC posterior subcapsular cataract; ERM epi-retinal membrane; PVD posterior vitreous detachment; RD retinal detachment; DM diabetes mellitus; DR diabetic retinopathy; NPDR non-proliferative diabetic retinopathy; PDR proliferative diabetic retinopathy; CSME clinically significant macular edema; DME diabetic macular edema; dbh dot blot hemorrhages; CWS cotton wool spot; POAG primary open angle glaucoma; C/D cup-to-disc ratio; HVF humphrey visual field; GVF goldmann visual field; OCT optical coherence tomography; IOP intraocular pressure; BRVO Branch retinal vein occlusion; CRVO central retinal vein occlusion; CRAO central retinal artery occlusion; BRAO branch retinal artery occlusion; RT retinal tear; SB scleral buckle; PPV pars plana vitrectomy; VH Vitreous hemorrhage; PRP panretinal laser photocoagulation; IVK intravitreal kenalog; VMT vitreomacular traction; MH Macular hole;  NVD neovascularization of the disc; NVE neovascularization elsewhere; AREDS age related eye disease study; ARMD age related macular degeneration; POAG primary open angle glaucoma; EBMD epithelial/anterior basement membrane dystrophy; ACIOL anterior chamber intraocular lens; IOL intraocular lens; PCIOL posterior chamber intraocular lens; Phaco/IOL phacoemulsification with intraocular lens placement; [place] photorefractive keratectomy; LASIK laser assisted in situ keratomileusis; HTN hypertension; DM diabetes mellitus; COPD chronic obstructive pulmonary disease

## 2021-05-01 NOTE — Assessment & Plan Note (Signed)
Today at 61-month follow-up again, with increased subretinal fluid and subretinal hyper reflective material foveal and inferiorly.  Repeat injection today Avastin and follow-up in 6 to 7 weeks OD to regain control of wet AMD still active

## 2021-06-12 ENCOUNTER — Other Ambulatory Visit: Payer: Self-pay

## 2021-06-12 ENCOUNTER — Ambulatory Visit (INDEPENDENT_AMBULATORY_CARE_PROVIDER_SITE_OTHER): Payer: PPO | Admitting: Ophthalmology

## 2021-06-12 ENCOUNTER — Encounter (INDEPENDENT_AMBULATORY_CARE_PROVIDER_SITE_OTHER): Payer: Self-pay | Admitting: Ophthalmology

## 2021-06-12 DIAGNOSIS — H353122 Nonexudative age-related macular degeneration, left eye, intermediate dry stage: Secondary | ICD-10-CM | POA: Diagnosis not present

## 2021-06-12 DIAGNOSIS — H353211 Exudative age-related macular degeneration, right eye, with active choroidal neovascularization: Secondary | ICD-10-CM | POA: Diagnosis not present

## 2021-06-12 MED ORDER — BEVACIZUMAB 2.5 MG/0.1ML IZ SOSY
2.5000 mg | PREFILLED_SYRINGE | INTRAVITREAL | Status: AC | PRN
  Administered 2021-06-12: 2.5 mg via INTRAVITREAL

## 2021-06-12 NOTE — Assessment & Plan Note (Signed)
Minor stable

## 2021-06-12 NOTE — Progress Notes (Signed)
06/12/2021     CHIEF COMPLAINT Patient presents for  Chief Complaint  Patient presents with   Retina Follow Up      HISTORY OF PRESENT ILLNESS: Ashley Huang is a 86 y.o. female who presents to the clinic today for:   HPI     Retina Follow Up           Diagnosis: Wet AMD   Laterality: right eye   Onset: 6 weeks ago   Severity: mild   Duration: 6 weeks   Course: gradually improving         Comments   6 week fu OD and Dilate OD and Avastin OD and OCT  Pt states VA OU stable and improved since last visit. Pt denies FOL, floaters, or ocular pain OU.  Pt state, " I do actually think that my vision in my right eye is better than it has been. I feel like I can see improvement."        Last edited by Hurman Horn, MD on 06/12/2021  2:24 PM.      Referring physician: Sharilyn Sites, MD [address] [place],  [place] [address]  HISTORICAL INFORMATION:   Selected notes from the [facility]: No current outpatient medications on file. (Ophthalmic Drugs)   No current facility-administered medications for this visit. (Ophthalmic Drugs)   Current Outpatient Medications (Other)  Medication Sig   benzonatate (TESSALON) 100 MG capsule Take 2 capsules (200 mg total) by mouth 3 (three) times daily as needed for cough.   calcium carbonate (OS-CAL) 600 MG TABS Take 600 mg by mouth daily.   cholecalciferol (VITAMIN D) 1000 UNITS tablet Take 1,000 Units by mouth daily.   doxycycline (VIBRAMYCIN) 100 MG capsule Take 1 capsule (100 mg total) by mouth 2 (two) times daily.   mometasone (NASONEX) 50 MCG/ACT nasal spray Place 2 sprays into the nose daily.   montelukast (SINGULAIR) 10 MG tablet Take 10 mg by mouth as needed (Allergies).    Multiple Vitamins-Minerals (PRESERVISION AREDS 2 PO) Take by mouth.   predniSONE (DELTASONE) 10 MG tablet Take by mouth.   No current facility-administered medications for this visit. (Other)       REVIEW OF SYSTEMS:    ALLERGIES Allergies  Allergen Reactions   Antihistamines, Diphenhydramine-Type Anaphylaxis and Swelling   Red Dye Rash   Sulfa Antibiotics Itching and Rash    PAST MEDICAL HISTORY Past Medical History:  Diagnosis Date   Breast cancer ([facility]) 1989.   DM (diabetes mellitus) ([facility])    type 2-diet controlled   Hypertension    Personal history of radiation therapy    Past Surgical History:  Procedure Laterality Date   BREAST LUMPECTOMY Right    CHOLECYSTECTOMY     COLONOSCOPY N/A 08/08/2012   Procedure: COLONOSCOPY;  Surgeon: Rogene Houston, MD;  [facility];  Service: Endoscopy;  Laterality: N/A;  1015-moved to 9:30 Ann notified pt   TONSILLECTOMY      FAMILY HISTORY Family History  Problem Relation Age of Onset   Hypertension Mother    Emphysema Mother    Cancer Father    Colon cancer Sister     SOCIAL HISTORY Social History   Tobacco Use   Smoking status: Never   Smokeless tobacco: Never  Substance Use Topics   Alcohol use: No   Drug use: No         OPHTHALMIC EXAM:  Base Eye  Exam     Visual Acuity (ETDRS)       Right Left   Dist [place] 20/50 -2 20/20   Dist ph [place] NI          Tonometry (Tonopen, 1:50 PM)       Right Left   Pressure 14 13         Pupils       Pupils Shape React APD   Right PERRL Round Brisk None   Left PERRL Round Brisk None         Visual Fields (Counting fingers)       Left Right    Full Full         Extraocular Movement       Right Left    Full, Ortho Full, Ortho         Neuro/Psych     Oriented x3: Yes   Mood/Affect: Normal         Dilation     Right eye: 1.0% Mydriacyl, 2.5% Phenylephrine @ 1:50 PM           Slit Lamp and Fundus Exam     External Exam       Right Left   External Normal Normal         Slit Lamp Exam       Right Left   Lids/Lashes Normal Normal   Conjunctiva/Sclera White and quiet White and quiet   Cornea Clear Clear    Anterior Chamber Deep and quiet Deep and quiet   Iris Round and reactive Round and reactive   Lens Posterior chamber intraocular lens Posterior chamber intraocular lens   Anterior Vitreous Normal Normal         Fundus Exam       Right Left   Posterior Vitreous Posterior vitreous detachment    Disc Peripapillary atrophy    C/D Ratio 0.3    Macula Retinal pigment epithelial mottling, no hemorrhage, Hard drusen, pigmentary Macular atrophy, Geographic atrophy, Soft drusen    Vessels Normal    Periphery Normal             IMAGING AND PROCEDURES  Imaging and Procedures for 06/12/21  OCT, Retina - OU - Both Eyes       Right Eye Quality was good. Scan locations included subfoveal. Central Foveal Thickness: 372. Progression has improved. Findings include intraretinal fluid, retinal drusen , subretinal hyper-reflective material, subretinal fluid.   Left Eye Quality was good. Scan locations included subfoveal. Central Foveal Thickness: 243. Progression has been stable. Findings include vitreomacular adhesion , retinal drusen .   Notes OD, with outer retinal atrophy now less subretinal and intraretinal fluid on 6-week follow-up interval today post Avastin, will repeat injection OD today and follow-up again in 6 to 7 weeks        Intravitreal Injection, Pharmacologic Agent - OD - Right Eye       Time Out 06/12/2021. 2:25 PM. Confirmed correct patient, procedure, site, and patient consented.   Anesthesia Topical anesthesia was used. Anesthetic medications included Lidocaine 4%.   Procedure Preparation included 10% betadine to eyelids, 5% betadine to ocular surface, Ofloxacin . A supplied needle was used.   Injection: 2.5 mg bevacizumab 2.5 MG/0.1ML   Route: Intravitreal, Site: Right Eye   NDC: [phone], Lot: [id number]   Post-op Post injection exam found visual acuity of at least counting fingers. The patient tolerated the procedure well. There were no  complications. The patient received written and verbal post  procedure care education. Post injection medications included ocuflox.              ASSESSMENT/PLAN:  Exudative age-related macular degeneration of right eye with active choroidal neovascularization (HCC) The nature of wet macular degeneration was discussed with the patient.  Forms of therapy reviewed include the use of Anti-VEGF medications injected painlessly into the eye, as well as other possible treatment modalities, including thermal laser therapy. Fellow eye involvement and risks were discussed with the patient. Upon the finding of wet age related macular degeneration, treatment will be offered. The treatment regimen is on a treat as needed basis with the intent to treat if necessary and extend interval of exams when possible. On average 1 out of 6 patients do not need lifetime therapy. However, the risk of recurrent disease is high for a lifetime.  Initially monthly, then periodic, examinations and evaluations will determine whether the next treatment is required on the day of the examination.  OD, vastly improved today at 6-week follow-up post injection Avastin with much less subretinal hyper reflective material and more defined irregular RPE scrolling from RPE rip  Intermediate stage nonexudative age-related macular degeneration of left eye Minor stable     ICD-10-CM   1. Exudative age-related macular degeneration of right eye with active choroidal neovascularization (HCC)  H35.3211 OCT, Retina - OU - Both Eyes    Intravitreal Injection, Pharmacologic Agent - OD - Right Eye    bevacizumab (AVASTIN) SOSY 2.5 mg    2. Intermediate stage nonexudative age-related macular degeneration of left eye  H35.3122       1.  OD, with subfoveal RPE rip, CNVM complex with RPE detachment, vastly improved vision and findings today at 6-week postinjection Avastin.  Repeat injection today maintain 6-week follow-up interval next  2.  OS  no sign of CNVM  3.  Ophthalmic Meds Ordered this visit:  Meds ordered this encounter  Medications   bevacizumab (AVASTIN) SOSY 2.5 mg       Return in about 6 weeks (around 07/24/2021) for dilate, OD, AVASTIN OCT.  There are no Patient Instructions on file for this visit.   Explained the diagnoses, plan, and follow up with the patient and they expressed understanding.  Patient expressed understanding of the importance of proper follow up care.   Clent Demark Mammie Meras M.D. Diseases & Surgery of the Retina and Vitreous Retina & Diabetic [place] 06/12/21     Abbreviations: M myopia (nearsighted); A astigmatism; H hyperopia (farsighted); P presbyopia; Mrx spectacle prescription;  CTL contact lenses; OD right eye; OS left eye; OU both eyes  XT exotropia; ET esotropia; PEK punctate epithelial keratitis; PEE punctate epithelial erosions; DES dry eye syndrome; MGD meibomian gland dysfunction; ATs artificial tears; PFAT's preservative free artificial tears; [place] nuclear sclerotic cataract; PSC posterior subcapsular cataract; ERM epi-retinal membrane; PVD posterior vitreous detachment; RD retinal detachment; DM diabetes mellitus; DR diabetic retinopathy; NPDR non-proliferative diabetic retinopathy; PDR proliferative diabetic retinopathy; CSME clinically significant macular edema; DME diabetic macular edema; dbh dot blot hemorrhages; CWS cotton wool spot; POAG primary open angle glaucoma; C/D cup-to-disc ratio; HVF humphrey visual field; GVF goldmann visual field; OCT optical coherence tomography; IOP intraocular pressure; BRVO Branch retinal vein occlusion; CRVO central retinal vein occlusion; CRAO central retinal artery occlusion; BRAO branch retinal artery occlusion; RT retinal tear; SB scleral buckle; PPV pars plana vitrectomy; VH Vitreous hemorrhage; PRP panretinal laser photocoagulation; IVK intravitreal kenalog; VMT vitreomacular traction; MH Macular hole;  NVD neovascularization of the disc; NVE  neovascularization  elsewhere; AREDS age related eye disease study; ARMD age related macular degeneration; POAG primary open angle glaucoma; EBMD epithelial/anterior basement membrane dystrophy; ACIOL anterior chamber intraocular lens; IOL intraocular lens; PCIOL posterior chamber intraocular lens; Phaco/IOL phacoemulsification with intraocular lens placement; [place] photorefractive keratectomy; LASIK laser assisted in situ keratomileusis; HTN hypertension; DM diabetes mellitus; COPD chronic obstructive pulmonary disease

## 2021-06-12 NOTE — Assessment & Plan Note (Signed)
The nature of wet macular degeneration was discussed with the patient.  Forms of therapy reviewed include the use of Anti-VEGF medications injected painlessly into the eye, as well as other possible treatment modalities, including thermal laser therapy. Fellow eye involvement and risks were discussed with the patient. Upon the finding of wet age related macular degeneration, treatment will be offered. The treatment regimen is on a treat as needed basis with the intent to treat if necessary and extend interval of exams when possible. On average 1 out of 6 patients do not need lifetime therapy. However, the risk of recurrent disease is high for a lifetime.  Initially monthly, then periodic, examinations and evaluations will determine whether the next treatment is required on the day of the examination.  OD, vastly improved today at 6-week follow-up post injection Avastin with much less subretinal hyper reflective material and more defined irregular RPE scrolling from RPE rip

## 2021-07-24 ENCOUNTER — Encounter (INDEPENDENT_AMBULATORY_CARE_PROVIDER_SITE_OTHER): Payer: Self-pay | Admitting: Ophthalmology

## 2021-07-24 ENCOUNTER — Other Ambulatory Visit: Payer: Self-pay

## 2021-07-24 ENCOUNTER — Ambulatory Visit (INDEPENDENT_AMBULATORY_CARE_PROVIDER_SITE_OTHER): Payer: PPO | Admitting: Ophthalmology

## 2021-07-24 DIAGNOSIS — H353211 Exudative age-related macular degeneration, right eye, with active choroidal neovascularization: Secondary | ICD-10-CM

## 2021-07-24 DIAGNOSIS — G4733 Obstructive sleep apnea (adult) (pediatric): Secondary | ICD-10-CM

## 2021-07-24 MED ORDER — BEVACIZUMAB 2.5 MG/0.1ML IZ SOSY
2.5000 mg | PREFILLED_SYRINGE | INTRAVITREAL | Status: AC | PRN
  Administered 2021-07-24: 2.5 mg via INTRAVITREAL

## 2021-07-24 NOTE — Progress Notes (Signed)
07/24/2021     CHIEF COMPLAINT Patient presents for  Chief Complaint  Patient presents with   Macular Degeneration      HISTORY OF PRESENT ILLNESS: Ashley Huang is a 86 y.o. female who presents to the clinic today for:   HPI   No interval change in vision OD stable over time.  Does see a small little distortion every morning upon awakening which seems to subside Last edited by Hurman Horn, MD on 07/24/2021  2:04 PM.      Referring physician: Sharilyn Sites, MD [address] [place],  [place] [address]  HISTORICAL INFORMATION:   Selected notes from the [facility]: No current outpatient medications on file. (Ophthalmic Drugs)   No current facility-administered medications for this visit. (Ophthalmic Drugs)   Current Outpatient Medications (Other)  Medication Sig   benzonatate (TESSALON) 100 MG capsule Take 2 capsules (200 mg total) by mouth 3 (three) times daily as needed for cough.   calcium carbonate (OS-CAL) 600 MG TABS Take 600 mg by mouth daily.   cholecalciferol (VITAMIN D) 1000 UNITS tablet Take 1,000 Units by mouth daily.   doxycycline (VIBRAMYCIN) 100 MG capsule Take 1 capsule (100 mg total) by mouth 2 (two) times daily.   mometasone (NASONEX) 50 MCG/ACT nasal spray Place 2 sprays into the nose daily.   montelukast (SINGULAIR) 10 MG tablet Take 10 mg by mouth as needed (Allergies).    Multiple Vitamins-Minerals (PRESERVISION AREDS 2 PO) Take by mouth.   predniSONE (DELTASONE) 10 MG tablet Take by mouth.   No current facility-administered medications for this visit. (Other)      REVIEW OF SYSTEMS: ROS   Negative for: Constitutional, Gastrointestinal, Neurological, Skin, Genitourinary, Musculoskeletal, HENT, Endocrine, Cardiovascular, Eyes, Respiratory, Psychiatric, Allergic/Imm, Heme/Lymph Last edited by Hurman Horn, MD on 07/24/2021  2:00 PM.       ALLERGIES Allergies  Allergen Reactions    Antihistamines, Diphenhydramine-Type Anaphylaxis and Swelling   Red Dye Rash   Sulfa Antibiotics Itching and Rash    PAST MEDICAL HISTORY Past Medical History:  Diagnosis Date   Breast cancer ([facility]) 1989.   DM (diabetes mellitus) ([facility])    type 2-diet controlled   Hypertension    Personal history of radiation therapy    Past Surgical History:  Procedure Laterality Date   BREAST LUMPECTOMY Right    CHOLECYSTECTOMY     COLONOSCOPY N/A 08/08/2012   Procedure: COLONOSCOPY;  Surgeon: Rogene Houston, MD;  [facility];  Service: Endoscopy;  Laterality: N/A;  1015-moved to 9:30 Ann notified pt   TONSILLECTOMY      FAMILY HISTORY Family History  Problem Relation Age of Onset   Hypertension Mother    Emphysema Mother    Cancer Father    Colon cancer Sister     SOCIAL HISTORY Social History   Tobacco Use   Smoking status: Never   Smokeless tobacco: Never  Substance Use Topics   Alcohol use: No   Drug use: No         OPHTHALMIC EXAM:  Base Eye Exam     Visual Acuity (ETDRS)       Right Left   Dist [place] 20/50 20/20   Dist ph [place] 20/40 -3          Tonometry (Tonopen, 2:03 PM)       Right Left   Pressure 15 12  Pupils       Pupils APD   Right PERRL None   Left PERRL          Neuro/Psych     Oriented x3: Yes   Mood/Affect: Normal           Slit Lamp and Fundus Exam     External Exam       Right Left   External Normal Normal         Slit Lamp Exam       Right Left   Lids/Lashes Normal Normal   Conjunctiva/Sclera White and quiet White and quiet   Cornea Clear Clear   Anterior Chamber Deep and quiet Deep and quiet   Iris Round and reactive Round and reactive   Lens Posterior chamber intraocular lens Posterior chamber intraocular lens   Anterior Vitreous Normal Normal         Fundus Exam       Right Left   Posterior Vitreous Posterior vitreous detachment    Disc Peripapillary atrophy    C/D Ratio 0.3     Macula Retinal pigment epithelial mottling, no hemorrhage, Hard drusen, pigmentary Macular atrophy, Geographic atrophy, Soft drusen    Vessels Normal    Periphery Normal             IMAGING AND PROCEDURES  Imaging and Procedures for 07/24/21  OCT, Retina - OU - Both Eyes       Right Eye Quality was good. Scan locations included subfoveal. Central Foveal Thickness: 378. Progression has improved. Findings include intraretinal fluid, retinal drusen , subretinal hyper-reflective material, subretinal fluid.   Left Eye Quality was good. Scan locations included subfoveal. Central Foveal Thickness: 250. Progression has been stable. Findings include vitreomacular adhesion , retinal drusen .   Notes OD, today at 6-week interval with increase in intraretinal fluid and subretinal fluid overlying the PED which may have in fact developed a RPE rip.      Intravitreal Injection, Pharmacologic Agent - OD - Right Eye       Time Out 07/24/2021. 2:04 PM. Confirmed correct patient, procedure, site, and patient consented.   Anesthesia Topical anesthesia was used. Anesthetic medications included Lidocaine 4%.   Procedure Preparation included 10% betadine to eyelids, 5% betadine to ocular surface, Ofloxacin . A 30 gauge needle was used.   Injection: 2.5 mg bevacizumab 2.5 MG/0.1ML   Route: Intravitreal, Site: Right Eye   NDC: [phone], Lot: [id number]   Post-op Post injection exam found visual acuity of at least counting fingers. The patient tolerated the procedure well. There were no complications. The patient received written and verbal post procedure care education. Post injection medications included ocuflox.              ASSESSMENT/PLAN:  Exudative age-related macular degeneration of right eye with active choroidal neovascularization (HCC) Wet AMD, with clinically stable yet by OCT worsening of findings with intraretinal fluid overlying PED and what may be an RPE rip with  scrolled RPE.  Will need repeat injection Avastin today at 6-week interval and follow-up in 5 weeks  Obstructive sleep apnea syndrome Patient reports still using CPAP nightly     ICD-10-CM   1. Exudative age-related macular degeneration of right eye with active choroidal neovascularization (HCC)  H35.3211 OCT, Retina - OU - Both Eyes    Intravitreal Injection, Pharmacologic Agent - OD - Right Eye    bevacizumab (AVASTIN) SOSY 2.5 mg    2. Obstructive sleep apnea syndrome  G47.33  1.  OD with RPE rip subfoveal which is new with wet AMD from PED.  Repeat injection Avastin today at 6-week interval and [place] interval examination next to 5 weeks  2.  3.  Ophthalmic Meds Ordered this visit:  Meds ordered this encounter  Medications   bevacizumab (AVASTIN) SOSY 2.5 mg       Return in about 5 weeks (around 08/28/2021) for dilate, OD, AVASTIN OCT.  There are no Patient Instructions on file for this visit.   Explained the diagnoses, plan, and follow up with the patient and they expressed understanding.  Patient expressed understanding of the importance of proper follow up care.   Clent Demark Kainat Pizana M.D. Diseases & Surgery of the Retina and Vitreous Retina & Diabetic [facility] 07/24/21     Abbreviations: M myopia (nearsighted); A astigmatism; H hyperopia (farsighted); P presbyopia; Mrx spectacle prescription;  CTL contact lenses; OD right eye; OS left eye; OU both eyes  XT exotropia; ET esotropia; PEK punctate epithelial keratitis; PEE punctate epithelial erosions; DES dry eye syndrome; MGD meibomian gland dysfunction; ATs artificial tears; PFAT's preservative free artificial tears; [place] nuclear sclerotic cataract; PSC posterior subcapsular cataract; ERM epi-retinal membrane; PVD posterior vitreous detachment; RD retinal detachment; DM diabetes mellitus; DR diabetic retinopathy; NPDR non-proliferative diabetic retinopathy; PDR proliferative diabetic retinopathy; CSME clinically  significant macular edema; DME diabetic macular edema; dbh dot blot hemorrhages; CWS cotton wool spot; POAG primary open angle glaucoma; C/D cup-to-disc ratio; HVF humphrey visual field; GVF goldmann visual field; OCT optical coherence tomography; IOP intraocular pressure; BRVO Branch retinal vein occlusion; CRVO central retinal vein occlusion; CRAO central retinal artery occlusion; BRAO branch retinal artery occlusion; RT retinal tear; SB scleral buckle; PPV pars plana vitrectomy; VH Vitreous hemorrhage; PRP panretinal laser photocoagulation; IVK intravitreal kenalog; VMT vitreomacular traction; MH Macular hole;  NVD neovascularization of the disc; NVE neovascularization elsewhere; AREDS age related eye disease study; ARMD age related macular degeneration; POAG primary open angle glaucoma; EBMD epithelial/anterior basement membrane dystrophy; ACIOL anterior chamber intraocular lens; IOL intraocular lens; PCIOL posterior chamber intraocular lens; Phaco/IOL phacoemulsification with intraocular lens placement; [place] photorefractive keratectomy; LASIK laser assisted in situ keratomileusis; HTN hypertension; DM diabetes mellitus; COPD chronic obstructive pulmonary disease

## 2021-07-24 NOTE — Assessment & Plan Note (Addendum)
Wet AMD, with clinically stable yet by OCT worsening of findings with intraretinal fluid overlying PED and what may be an RPE rip with scrolled RPE. ? ?Will need repeat injection Avastin today at 6-week interval and follow-up in 5 weeks ?

## 2021-07-24 NOTE — Assessment & Plan Note (Signed)
Patient reports still using CPAP nightly ?

## 2021-08-01 DIAGNOSIS — L820 Inflamed seborrheic keratosis: Secondary | ICD-10-CM | POA: Diagnosis not present

## 2021-08-01 DIAGNOSIS — L821 Other seborrheic keratosis: Secondary | ICD-10-CM | POA: Diagnosis not present

## 2021-08-01 DIAGNOSIS — Z23 Encounter for immunization: Secondary | ICD-10-CM | POA: Diagnosis not present

## 2021-08-01 DIAGNOSIS — L570 Actinic keratosis: Secondary | ICD-10-CM | POA: Diagnosis not present

## 2021-08-02 DIAGNOSIS — E663 Overweight: Secondary | ICD-10-CM | POA: Diagnosis not present

## 2021-08-02 DIAGNOSIS — E785 Hyperlipidemia, unspecified: Secondary | ICD-10-CM | POA: Diagnosis not present

## 2021-08-02 DIAGNOSIS — H3530 Unspecified macular degeneration: Secondary | ICD-10-CM | POA: Diagnosis not present

## 2021-08-02 DIAGNOSIS — E559 Vitamin D deficiency, unspecified: Secondary | ICD-10-CM | POA: Diagnosis not present

## 2021-08-02 DIAGNOSIS — I10 Essential (primary) hypertension: Secondary | ICD-10-CM | POA: Diagnosis not present

## 2021-08-02 DIAGNOSIS — E782 Mixed hyperlipidemia: Secondary | ICD-10-CM | POA: Diagnosis not present

## 2021-08-02 DIAGNOSIS — K219 Gastro-esophageal reflux disease without esophagitis: Secondary | ICD-10-CM | POA: Diagnosis not present

## 2021-08-02 DIAGNOSIS — E119 Type 2 diabetes mellitus without complications: Secondary | ICD-10-CM | POA: Diagnosis not present

## 2021-08-02 DIAGNOSIS — Z6827 Body mass index (BMI) 27.0-27.9, adult: Secondary | ICD-10-CM | POA: Diagnosis not present

## 2021-08-02 DIAGNOSIS — C50119 Malignant neoplasm of central portion of unspecified female breast: Secondary | ICD-10-CM | POA: Diagnosis not present

## 2021-08-28 ENCOUNTER — Ambulatory Visit (INDEPENDENT_AMBULATORY_CARE_PROVIDER_SITE_OTHER): Payer: PPO | Admitting: Ophthalmology

## 2021-08-28 ENCOUNTER — Encounter (INDEPENDENT_AMBULATORY_CARE_PROVIDER_SITE_OTHER): Payer: Self-pay | Admitting: Ophthalmology

## 2021-08-28 DIAGNOSIS — H35351 Cystoid macular degeneration, right eye: Secondary | ICD-10-CM | POA: Diagnosis not present

## 2021-08-28 DIAGNOSIS — H353211 Exudative age-related macular degeneration, right eye, with active choroidal neovascularization: Secondary | ICD-10-CM

## 2021-08-28 DIAGNOSIS — H353122 Nonexudative age-related macular degeneration, left eye, intermediate dry stage: Secondary | ICD-10-CM

## 2021-08-28 MED ORDER — BEVACIZUMAB 2.5 MG/0.1ML IZ SOSY
2.5000 mg | PREFILLED_SYRINGE | INTRAVITREAL | Status: AC | PRN
  Administered 2021-08-28: 2.5 mg via INTRAVITREAL

## 2021-08-28 NOTE — Assessment & Plan Note (Signed)
OD currently at 5-week post follow-up with less intraretinal fluid overlying vascularized PED with still persistent subretinal fluid post Avastin repeat injection today and consider change of medication if not improved next ?

## 2021-08-28 NOTE — Progress Notes (Signed)
? ? ?08/28/2021 ? ?  ? ?CHIEF COMPLAINT ?Patient presents for  ?Chief Complaint  ?Patient presents with  ? Macular Degeneration  ? ? ?With recent history of wet AMD worsening macular findings on OCT likely associated with an RPE rip.  Today at 5-week follow-up ? ?HISTORY OF PRESENT ILLNESS: ?Ashley Huang is a 86 y.o. female who presents to the clinic today for:  ? ?HPI   ?5 weeks for DILATE, AVASTIN OCT. ?Pt stating that she really has to focus in order to see clearly. ?Pt denies FOL but sees floaters when under the sun but then goes away. ? ? ?Last edited by Silvestre Moment on 08/28/2021  1:43 PM.  ?  ? ? ?Referring physician: ?Sharilyn Sites, MD ?[address] ?[place],  [place] [address] ? ?HISTORICAL INFORMATION:  ? ?Selected notes from the [facility] ?  ?   ? ?CURRENT MEDICATIONS: ?No current outpatient medications on file. (Ophthalmic Drugs)  ? ?No current facility-administered medications for this visit. (Ophthalmic Drugs)  ? ?Current Outpatient Medications (Other)  ?Medication Sig  ? benzonatate (TESSALON) 100 MG capsule Take 2 capsules (200 mg total) by mouth 3 (three) times daily as needed for cough.  ? calcium carbonate (OS-CAL) 600 MG TABS Take 600 mg by mouth daily.  ? cholecalciferol (VITAMIN D) 1000 UNITS tablet Take 1,000 Units by mouth daily.  ? doxycycline (VIBRAMYCIN) 100 MG capsule Take 1 capsule (100 mg total) by mouth 2 (two) times daily.  ? mometasone (NASONEX) 50 MCG/ACT nasal spray Place 2 sprays into the nose daily.  ? montelukast (SINGULAIR) 10 MG tablet Take 10 mg by mouth as needed (Allergies).   ? Multiple Vitamins-Minerals (PRESERVISION AREDS 2 PO) Take by mouth.  ? predniSONE (DELTASONE) 10 MG tablet Take by mouth.  ? ?No current facility-administered medications for this visit. (Other)  ? ? ? ? ?REVIEW OF SYSTEMS: ?ROS   ?Negative for: Constitutional, Gastrointestinal, Neurological, Skin, Genitourinary, Musculoskeletal, HENT, Endocrine, Cardiovascular, Eyes, Respiratory,  Psychiatric, Allergic/Imm, Heme/Lymph ?Last edited by Silvestre Moment on 08/28/2021  1:43 PM.  ?  ? ? ? ?ALLERGIES ?Allergies  ?Allergen Reactions  ? Antihistamines, Diphenhydramine-Type Anaphylaxis and Swelling  ? Red Dye Rash  ? Sulfa Antibiotics Itching and Rash  ? ? ?PAST MEDICAL HISTORY ?Past Medical History:  ?Diagnosis Date  ? Breast cancer ([place]) 1989.  ? DM (diabetes mellitus) ([place])   ? type 2-diet controlled  ? Hypertension   ? Personal history of radiation therapy   ? ?Past Surgical History:  ?Procedure Laterality Date  ? BREAST LUMPECTOMY Right   ? CHOLECYSTECTOMY    ? COLONOSCOPY N/A 08/08/2012  ? Procedure: COLONOSCOPY;  Surgeon: Rogene Houston, MD;  [facility];  Service: Endoscopy;  Laterality: N/A;  1015-moved to 9:30 Ann notified pt  ? TONSILLECTOMY    ? ? ?FAMILY HISTORY ?Family History  ?Problem Relation Age of Onset  ? Hypertension Mother   ? Emphysema Mother   ? Cancer Father   ? Colon cancer Sister   ? ? ?SOCIAL HISTORY ?Social History  ? ?Tobacco Use  ? Smoking status: Never  ? Smokeless tobacco: Never  ?Substance Use Topics  ? Alcohol use: No  ? Drug use: No  ? ?  ? ?  ? ?OPHTHALMIC EXAM: ? ?Base Eye Exam   ? ? Visual Acuity (ETDRS)   ? ?   Right Left  ? Dist [place] 20/60 20/20  ? Dist ph Gray 20/40 -1   ? ?  ?  ? ?  Tonometry (Tonopen, 1:49 PM)   ? ?   Right Left  ? Pressure 10 11  ? ?  ?  ? ? Pupils   ? ?   Pupils APD  ? Right PERRL None  ? Left PERRL None  ? ?  ?  ? ? Visual Fields   ? ?   Left Right  ?  Full Full  ? ?  ?  ? ? Extraocular Movement   ? ?   Right Left  ?  Full Full  ? ?  ?  ? ? Neuro/Psych   ? ? Oriented x3: Yes  ? Mood/Affect: Normal  ? ?  ?  ? ? Dilation   ? ? Right eye: 1.0% Mydriacyl, 2.5% Phenylephrine @ 1:48 PM  ? ?  ?  ? ?  ? ?Slit Lamp and Fundus Exam   ? ? External Exam   ? ?   Right Left  ? External Normal Normal  ? ?  ?  ? ? Slit Lamp Exam   ? ?   Right Left  ? Lids/Lashes Normal Normal  ? Conjunctiva/Sclera White and quiet White and quiet  ? Cornea Clear Clear  ?  Anterior Chamber Deep and quiet Deep and quiet  ? Iris Round and reactive Round and reactive  ? Lens Posterior chamber intraocular lens Posterior chamber intraocular lens  ? Anterior Vitreous Normal Normal  ? ?  ?  ? ? Fundus Exam   ? ?   Right Left  ? Posterior Vitreous Posterior vitreous detachment   ? Disc Peripapillary atrophy   ? C/D Ratio 0.3   ? Macula Retinal pigment epithelial mottling, no hemorrhage, Hard drusen, pigmentary Macular atrophy, Geographic atrophy, Soft drusen, now with evidence of an RPE rip and scrolled RPE clinically   ? Vessels Normal   ? Periphery Normal   ? ?  ?  ? ?  ? ? ?IMAGING AND PROCEDURES  ?Imaging and Procedures for 08/28/21 ? ?OCT, Retina - OU - Both Eyes   ? ?   ?Right Eye ?Quality was good. Scan locations included subfoveal. Central Foveal Thickness: 320. Progression has improved. Findings include intraretinal fluid, retinal drusen , subretinal hyper-reflective material, subretinal fluid.  ? ?Left Eye ?Quality was good. Scan locations included subfoveal. Central Foveal Thickness: 243. Progression has been stable. Findings include vitreomacular adhesion , retinal drusen .  ? ?Notes ?OD, today at 5-week interval with decrease in intraretinal fluid and subretinal fluid overlying the PED which may have in fact developed a RPE rip, now confirmed clinically ? ? ?  ? ?Intravitreal Injection, Pharmacologic Agent - OD - Right Eye   ? ?   ?Time Out ?08/28/2021. 2:40 PM. Confirmed correct patient, procedure, site, and patient consented.  ? ?Anesthesia ?Topical anesthesia was used. Anesthetic medications included Lidocaine 4%.  ? ?Procedure ?Preparation included 10% betadine to eyelids, 5% betadine to ocular surface, Ofloxacin . A 30 gauge needle was used.  ? ?Injection: ?2.5 mg bevacizumab 2.5 MG/0.1ML ?  Route: Intravitreal, Site: Right Eye ?  [place]: 76160-737-10, Lot: [id number]  ? ?Post-op ?Post injection exam found visual acuity of at least counting fingers. The patient tolerated the  procedure well. There were no complications. The patient received written and verbal post procedure care education. Post injection medications included ocuflox.  ? ?  ? ? ?  ?  ? ?  ?ASSESSMENT/PLAN: ? ?Cystoid macular edema of right eye ?Part of wet AMD OD and improving ? ?Exudative age-related macular degeneration  of right eye with active choroidal neovascularization ([place]) ?OD currently at 5-week post follow-up with less intraretinal fluid overlying vascularized PED with still persistent subretinal fluid post Avastin repeat injection today and consider change of medication if not improved next ? ?Intermediate stage nonexudative age-related macular degeneration of left eye ?No sign of CNVM OS  ? ?  ICD-10-CM   ?1. Exudative age-related macular degeneration of right eye with active choroidal neovascularization (HCC)  H35.3211 OCT, Retina - OU - Both Eyes  ?  Intravitreal Injection, Pharmacologic Agent - OD - Right Eye  ?  bevacizumab (AVASTIN) SOSY 2.5 mg  ?  ?2. Cystoid macular edema of right eye  H35.351   ?  ?3. Intermediate stage nonexudative age-related macular degeneration of left eye  H35.3122   ?  ? ? ?1.  OD improving, less subretinal and intraretinal fluid stable acuity and improved OCT findings 5 weeks post Avastin will repeat again injection today ? ?2. ? ?3. ? ?Ophthalmic Meds Ordered this visit:  ?Meds ordered this encounter  ?Medications  ? bevacizumab (AVASTIN) SOSY 2.5 mg  ? ? ?  ? ?Return in about 5 weeks (around 10/02/2021) for dilate, OD, AVASTIN OCT. ? ?There are no Patient Instructions on file for this visit. ? ? ?Explained the diagnoses, plan, and follow up with the patient and they expressed understanding.  Patient expressed understanding of the importance of proper follow up care.  ? ?Clent Demark. Missi Mcmackin M.D. ?Diseases & Surgery of the Retina and Vitreous ?[facility] ?08/28/21 ? ? ? ? ?Abbreviations: ?M myopia (nearsighted); A astigmatism; H hyperopia (farsighted); P presbyopia;  Mrx spectacle prescription;  CTL contact lenses; OD right eye; OS left eye; OU both eyes  XT exotropia; ET esotropia; PEK punctate epithelial keratitis; PEE punctate epithelial erosions; DES dry eye syndrome; MGD m

## 2021-08-28 NOTE — Assessment & Plan Note (Signed)
No sign of CNVM OS 

## 2021-08-28 NOTE — Assessment & Plan Note (Signed)
Part of wet AMD OD and improving ?

## 2021-08-29 DIAGNOSIS — E663 Overweight: Secondary | ICD-10-CM | POA: Diagnosis not present

## 2021-08-29 DIAGNOSIS — Z6826 Body mass index (BMI) 26.0-26.9, adult: Secondary | ICD-10-CM | POA: Diagnosis not present

## 2021-08-29 DIAGNOSIS — B029 Zoster without complications: Secondary | ICD-10-CM | POA: Diagnosis not present

## 2021-10-02 ENCOUNTER — Ambulatory Visit (INDEPENDENT_AMBULATORY_CARE_PROVIDER_SITE_OTHER): Payer: PPO | Admitting: Ophthalmology

## 2021-10-02 ENCOUNTER — Encounter (INDEPENDENT_AMBULATORY_CARE_PROVIDER_SITE_OTHER): Payer: Self-pay | Admitting: Ophthalmology

## 2021-10-02 DIAGNOSIS — H353122 Nonexudative age-related macular degeneration, left eye, intermediate dry stage: Secondary | ICD-10-CM | POA: Diagnosis not present

## 2021-10-02 DIAGNOSIS — H353211 Exudative age-related macular degeneration, right eye, with active choroidal neovascularization: Secondary | ICD-10-CM

## 2021-10-02 MED ORDER — BEVACIZUMAB 2.5 MG/0.1ML IZ SOSY
2.5000 mg | PREFILLED_SYRINGE | INTRAVITREAL | Status: AC | PRN
  Administered 2021-10-02: 2.5 mg via INTRAVITREAL

## 2021-10-02 NOTE — Progress Notes (Signed)
10/02/2021     CHIEF COMPLAINT Patient presents for  Chief Complaint  Patient presents with   Macular Degeneration      HISTORY OF PRESENT ILLNESS: Ashley Huang is a 86 y.o. female who presents to the clinic today for:   HPI   OD, patient says vision seems to be improving.  Today at 5 weeks post most recent injection Avastin OD Last edited by Hurman Horn, MD on 10/02/2021  2:47 PM.      Referring physician: Sharilyn Sites, MD [address] [place],   [address]  HISTORICAL INFORMATION:   Selected notes from the [facility]: No current outpatient medications on file. (Ophthalmic Drugs)   No current facility-administered medications for this visit. (Ophthalmic Drugs)   Current Outpatient Medications (Other)  Medication Sig   benzonatate (TESSALON) 100 MG capsule Take 2 capsules (200 mg total) by mouth 3 (three) times daily as needed for cough.   calcium carbonate (OS-CAL) 600 MG TABS Take 600 mg by mouth daily.   cholecalciferol (VITAMIN D) 1000 UNITS tablet Take 1,000 Units by mouth daily.   doxycycline (VIBRAMYCIN) 100 MG capsule Take 1 capsule (100 mg total) by mouth 2 (two) times daily.   mometasone (NASONEX) 50 MCG/ACT nasal spray Place 2 sprays into the nose daily.   montelukast (SINGULAIR) 10 MG tablet Take 10 mg by mouth as needed (Allergies).    Multiple Vitamins-Minerals (PRESERVISION AREDS 2 PO) Take by mouth.   predniSONE (DELTASONE) 10 MG tablet Take by mouth.   No current facility-administered medications for this visit. (Other)      REVIEW OF SYSTEMS: ROS   Negative for: Constitutional, Gastrointestinal, Neurological, Skin, Genitourinary, Musculoskeletal, HENT, Endocrine, Cardiovascular, Eyes, Respiratory, Psychiatric, Allergic/Imm, Heme/Lymph Last edited by Hurman Horn, MD on 10/02/2021  2:44 PM.       ALLERGIES Allergies  Allergen Reactions   Antihistamines, Diphenhydramine-Type  Anaphylaxis and Swelling   Red Dye Rash   Sulfa Antibiotics Itching and Rash    PAST MEDICAL HISTORY Past Medical History:  Diagnosis Date   Breast cancer ([facility]) 1989.   DM (diabetes mellitus) ([place])    type 2-diet controlled   Hypertension    Personal history of radiation therapy    Past Surgical History:  Procedure Laterality Date   BREAST LUMPECTOMY Right    CHOLECYSTECTOMY     COLONOSCOPY N/A 08/08/2012   Procedure: COLONOSCOPY;  Surgeon: Rogene Houston, MD;  [facility];  Service: Endoscopy;  Laterality: N/A;  1015-moved to 9:30 Ann notified pt   TONSILLECTOMY      FAMILY HISTORY Family History  Problem Relation Age of Onset   Hypertension Mother    Emphysema Mother    Cancer Father    Colon cancer Sister     SOCIAL HISTORY Social History   Tobacco Use   Smoking status: Never   Smokeless tobacco: Never  Substance Use Topics   Alcohol use: No   Drug use: No         OPHTHALMIC EXAM:  Base Eye Exam     Visual Acuity (ETDRS)       Right Left   Dist [place] 20/60 -2 20/20         Tonometry (Tonopen, 2:47 PM)       Right Left   Pressure 11 13         Neuro/Psych     Oriented x3: Yes   Mood/Affect: Normal  Dilation     Right eye: 1.0% Mydriacyl, 2.5% Phenylephrine @ 2:47 PM           Slit Lamp and Fundus Exam     External Exam       Right Left   External Normal Normal         Slit Lamp Exam       Right Left   Lids/Lashes Normal Normal   Conjunctiva/Sclera White and quiet White and quiet   Cornea Clear Clear   Anterior Chamber Deep and quiet Deep and quiet   Iris Round and reactive Round and reactive   Lens Posterior chamber intraocular lens Posterior chamber intraocular lens   Anterior Vitreous Normal Normal         Fundus Exam       Right Left   Posterior Vitreous Posterior vitreous detachment    Disc Peripapillary atrophy    C/D Ratio 0.3    Macula Retinal pigment epithelial mottling, no  hemorrhage, Hard drusen, pigmentary Macular atrophy, Geographic atrophy, Soft drusen, now with evidence of an RPE rip and scrolled RPE clinically    Vessels Normal    Periphery Normal             IMAGING AND PROCEDURES  Imaging and Procedures for 10/02/21  OCT, Retina - OU - Both Eyes       Right Eye Quality was good. Scan locations included subfoveal. Central Foveal Thickness: 363. Progression has improved. Findings include intraretinal fluid, retinal drusen , subretinal hyper-reflective material, subretinal fluid.   Left Eye Quality was good. Scan locations included subfoveal. Central Foveal Thickness: 253. Progression has been stable. Findings include vitreomacular adhesion , retinal drusen .   Notes OD, today at 5-week interval with decrease in intraretinal fluid and subretinal fluid overlying the PED which may have in fact developed a RPE rip, now confirmed clinically, improved overall at 5-week interval post injection      Intravitreal Injection, Pharmacologic Agent - OD - Right Eye       Time Out 10/02/2021. 2:48 PM. Confirmed correct patient, procedure, site, and patient consented.   Anesthesia Topical anesthesia was used. Anesthetic medications included Lidocaine 4%.   Procedure Preparation included 10% betadine to eyelids, 5% betadine to ocular surface, Ofloxacin . A 30 gauge needle was used.   Injection: 2.5 mg bevacizumab 2.5 MG/0.1ML   Route: Intravitreal, Site: Right Eye   [place]: [id number], Lot: [id number], Expiration date: 11/16/2021   Post-op Post injection exam found visual acuity of at least counting fingers. The patient tolerated the procedure well. There were no complications. The patient received written and verbal post procedure care education. Post injection medications included ocuflox.              ASSESSMENT/PLAN:  Exudative age-related macular degeneration of right eye with active choroidal neovascularization (HCC) Improved overall,  CNVM with less subretinal fluid today at 5-week interval post injection.  Overlying RPE rip.  Likely to be associated chronic subretinal fluid nonetheless improved and we will repeat injection today and maintain 5-week of interval evaluation next  Intermediate stage nonexudative age-related macular degeneration of left eye No sign of CNVM by OCT     ICD-10-CM   1. Exudative age-related macular degeneration of right eye with active choroidal neovascularization (HCC)  H35.3211 OCT, Retina - OU - Both Eyes    Intravitreal Injection, Pharmacologic Agent - OD - Right Eye    bevacizumab (AVASTIN) SOSY 2.5 mg    2. Intermediate stage nonexudative age-related macular  degeneration of left eye  H35.3122       1.  Vastly improved CNVM and associated RPE rip from a PED from wet AMD.  Less subretinal fluid at 5-week interval post injection Avastin.  Repeat injection today to maintain  2.  3.  Ophthalmic Meds Ordered this visit:  Meds ordered this encounter  Medications   bevacizumab (AVASTIN) SOSY 2.5 mg       Return in about 5 weeks (around 11/06/2021) for dilate, OD, AVASTIN OCT.  There are no Patient Instructions on file for this visit.   Explained the diagnoses, plan, and follow up with the patient and they expressed understanding.  Patient expressed understanding of the importance of proper follow up care.   Clent Demark Marva Hendryx M.D. Diseases & Surgery of the Retina and Vitreous [facility] [place] 10/02/21     Abbreviations: M myopia (nearsighted); A astigmatism; H hyperopia (farsighted); P presbyopia; Mrx spectacle prescription;  CTL contact lenses; OD right eye; OS left eye; OU both eyes  XT exotropia; ET esotropia; PEK punctate epithelial keratitis; PEE punctate epithelial erosions; DES dry eye syndrome; MGD meibomian gland dysfunction; ATs artificial tears; PFAT's preservative free artificial tears; Point nuclear sclerotic cataract; PSC posterior subcapsular cataract; ERM  epi-retinal membrane; PVD posterior vitreous detachment; RD retinal detachment; DM diabetes mellitus; DR diabetic retinopathy; NPDR non-proliferative diabetic retinopathy; PDR proliferative diabetic retinopathy; CSME clinically significant macular edema; DME diabetic macular edema; dbh dot blot hemorrhages; CWS cotton wool spot; POAG primary open angle glaucoma; C/D cup-to-disc ratio; HVF humphrey visual field; GVF goldmann visual field; OCT optical coherence tomography; IOP intraocular pressure; BRVO Branch retinal vein occlusion; CRVO central retinal vein occlusion; CRAO central retinal artery occlusion; BRAO branch retinal artery occlusion; RT retinal tear; SB scleral buckle; PPV pars plana vitrectomy; VH Vitreous hemorrhage; PRP panretinal laser photocoagulation; IVK intravitreal kenalog; VMT vitreomacular traction; MH Macular hole;  NVD neovascularization of the disc; NVE neovascularization elsewhere; AREDS age related eye disease study; ARMD age related macular degeneration; POAG primary open angle glaucoma; EBMD epithelial/anterior basement membrane dystrophy; ACIOL anterior chamber intraocular lens; IOL intraocular lens; PCIOL posterior chamber intraocular lens; Phaco/IOL phacoemulsification with intraocular lens placement; [place] photorefractive keratectomy; LASIK laser assisted in situ keratomileusis; HTN hypertension; DM diabetes mellitus; COPD chronic obstructive pulmonary disease

## 2021-10-02 NOTE — Assessment & Plan Note (Signed)
No sign of CNVM by OCT 

## 2021-10-02 NOTE — Assessment & Plan Note (Signed)
Improved overall, CNVM with less subretinal fluid today at 5-week interval post injection.  Overlying RPE rip.  Likely to be associated chronic subretinal fluid nonetheless improved and we will repeat injection today and maintain 5-week of interval evaluation next

## 2021-10-04 DIAGNOSIS — H353212 Exudative age-related macular degeneration, right eye, with inactive choroidal neovascularization: Secondary | ICD-10-CM | POA: Diagnosis not present

## 2021-10-04 DIAGNOSIS — H0102A Squamous blepharitis right eye, upper and lower eyelids: Secondary | ICD-10-CM | POA: Diagnosis not present

## 2021-10-04 DIAGNOSIS — H02831 Dermatochalasis of right upper eyelid: Secondary | ICD-10-CM | POA: Diagnosis not present

## 2021-10-04 DIAGNOSIS — H1045 Other chronic allergic conjunctivitis: Secondary | ICD-10-CM | POA: Diagnosis not present

## 2021-10-04 DIAGNOSIS — H16143 Punctate keratitis, bilateral: Secondary | ICD-10-CM | POA: Diagnosis not present

## 2021-10-04 DIAGNOSIS — E119 Type 2 diabetes mellitus without complications: Secondary | ICD-10-CM | POA: Diagnosis not present

## 2021-10-04 DIAGNOSIS — H353122 Nonexudative age-related macular degeneration, left eye, intermediate dry stage: Secondary | ICD-10-CM | POA: Diagnosis not present

## 2021-10-04 DIAGNOSIS — H43393 Other vitreous opacities, bilateral: Secondary | ICD-10-CM | POA: Diagnosis not present

## 2021-10-04 DIAGNOSIS — H02834 Dermatochalasis of left upper eyelid: Secondary | ICD-10-CM | POA: Diagnosis not present

## 2021-10-04 DIAGNOSIS — H04123 Dry eye syndrome of bilateral lacrimal glands: Secondary | ICD-10-CM | POA: Diagnosis not present

## 2021-10-04 DIAGNOSIS — H18513 Endothelial corneal dystrophy, bilateral: Secondary | ICD-10-CM | POA: Diagnosis not present

## 2021-10-04 DIAGNOSIS — Z961 Presence of intraocular lens: Secondary | ICD-10-CM | POA: Diagnosis not present

## 2021-11-06 ENCOUNTER — Encounter (INDEPENDENT_AMBULATORY_CARE_PROVIDER_SITE_OTHER): Payer: Self-pay | Admitting: Ophthalmology

## 2021-11-06 ENCOUNTER — Ambulatory Visit (INDEPENDENT_AMBULATORY_CARE_PROVIDER_SITE_OTHER): Payer: PPO | Admitting: Ophthalmology

## 2021-11-06 DIAGNOSIS — H353113 Nonexudative age-related macular degeneration, right eye, advanced atrophic without subfoveal involvement: Secondary | ICD-10-CM

## 2021-11-06 DIAGNOSIS — H353211 Exudative age-related macular degeneration, right eye, with active choroidal neovascularization: Secondary | ICD-10-CM | POA: Diagnosis not present

## 2021-11-06 DIAGNOSIS — H353122 Nonexudative age-related macular degeneration, left eye, intermediate dry stage: Secondary | ICD-10-CM

## 2021-11-06 DIAGNOSIS — H35071 Retinal telangiectasis, right eye: Secondary | ICD-10-CM | POA: Diagnosis not present

## 2021-11-06 DIAGNOSIS — G4733 Obstructive sleep apnea (adult) (pediatric): Secondary | ICD-10-CM

## 2021-11-06 MED ORDER — BEVACIZUMAB 2.5 MG/0.1ML IZ SOSY
2.5000 mg | PREFILLED_SYRINGE | INTRAVITREAL | Status: AC | PRN
  Administered 2021-11-06: 2.5 mg via INTRAVITREAL

## 2021-11-06 NOTE — Assessment & Plan Note (Signed)
Compliant on CPAP, minimizes hypoxic damage to the eye nightly

## 2021-11-06 NOTE — Assessment & Plan Note (Signed)
Doing well OD post Avastin with preserved acuity today.  At 5-week interval today.  We will repeat injection today and reevaluate next in 5-weeks

## 2021-12-11 ENCOUNTER — Encounter (INDEPENDENT_AMBULATORY_CARE_PROVIDER_SITE_OTHER): Payer: Self-pay | Admitting: Ophthalmology

## 2021-12-11 ENCOUNTER — Ambulatory Visit (INDEPENDENT_AMBULATORY_CARE_PROVIDER_SITE_OTHER): Payer: PPO | Admitting: Ophthalmology

## 2021-12-11 DIAGNOSIS — H43822 Vitreomacular adhesion, left eye: Secondary | ICD-10-CM | POA: Diagnosis not present

## 2021-12-11 DIAGNOSIS — H353211 Exudative age-related macular degeneration, right eye, with active choroidal neovascularization: Secondary | ICD-10-CM | POA: Diagnosis not present

## 2021-12-11 DIAGNOSIS — H353122 Nonexudative age-related macular degeneration, left eye, intermediate dry stage: Secondary | ICD-10-CM | POA: Diagnosis not present

## 2021-12-11 MED ORDER — BEVACIZUMAB CHEMO INJECTION 1.25MG/0.05ML SYRINGE FOR KALEIDOSCOPE
1.2500 mg | INTRAVITREAL | Status: AC | PRN
  Administered 2021-12-11: 1.25 mg via INTRAVITREAL

## 2021-12-11 NOTE — Assessment & Plan Note (Signed)
Much improved and stable acuity.  From an old RPE rip associated PED from CNVM.  Now with less intraretinal fluid and subretinal fluid on more frequent injection Avastin, repeat today and maintain follow-up evaluation again in 5 weeks right eye to maintain acuity

## 2021-12-11 NOTE — Progress Notes (Signed)
12/11/2021     CHIEF COMPLAINT Patient presents for  Chief Complaint  Patient presents with   Macular Degeneration      HISTORY OF PRESENT ILLNESS: Ashley Huang is a 86 y.o. female who presents to the clinic today for:   HPI   5 weeks for DILATE OU, AVASTIN OCT, OD. Pt stated vision has been stable since last visit. Pt denies new floaters and FOL.   Last edited by Silvestre Moment on 12/11/2021  1:21 PM.      Referring physician: Sharilyn Sites, MD [address] [place],  [place] [address]  HISTORICAL INFORMATION:   Selected notes from the MEDICAL RECORD NUMBER       CURRENT MEDICATIONS: No current outpatient medications on file. (Ophthalmic Drugs)   No current facility-administered medications for this visit. (Ophthalmic Drugs)   Current Outpatient Medications (Other)  Medication Sig   benzonatate (TESSALON) 100 MG capsule Take 2 capsules (200 mg total) by mouth 3 (three) times daily as needed for cough.   calcium carbonate (OS-CAL) 600 MG TABS Take 600 mg by mouth daily.   cholecalciferol (VITAMIN D) 1000 UNITS tablet Take 1,000 Units by mouth daily.   doxycycline (VIBRAMYCIN) 100 MG capsule Take 1 capsule (100 mg total) by mouth 2 (two) times daily.   mometasone (NASONEX) 50 MCG/ACT nasal spray Place 2 sprays into the nose daily.   montelukast (SINGULAIR) 10 MG tablet Take 10 mg by mouth as needed (Allergies).    Multiple Vitamins-Minerals (PRESERVISION AREDS 2 PO) Take by mouth.   predniSONE (DELTASONE) 10 MG tablet Take by mouth.   No current facility-administered medications for this visit. (Other)      REVIEW OF SYSTEMS: ROS   Negative for: Constitutional, Gastrointestinal, Neurological, Skin, Genitourinary, Musculoskeletal, HENT, Endocrine, Cardiovascular, Eyes, Respiratory, Psychiatric, Allergic/Imm, Heme/Lymph Last edited by Silvestre Moment on 12/11/2021  1:21 PM.       ALLERGIES Allergies  Allergen Reactions   Antihistamines, Diphenhydramine-Type  Anaphylaxis and Swelling   Red Dye Rash   Sulfa Antibiotics Itching and Rash    PAST MEDICAL HISTORY Past Medical History:  Diagnosis Date   Breast cancer ([place]) 1989.   DM (diabetes mellitus) ([place])    type 2-diet controlled   Hypertension    Personal history of radiation therapy    Past Surgical History:  Procedure Laterality Date   BREAST LUMPECTOMY Right    CHOLECYSTECTOMY     COLONOSCOPY N/A 08/08/2012   Procedure: COLONOSCOPY;  Surgeon: Rogene Houston, MD;  [facility]: [facility];  Service: Endoscopy;  Laterality: N/A;  1015-moved to 9:30 Ann notified pt   TONSILLECTOMY      FAMILY HISTORY Family History  Problem Relation Age of Onset   Hypertension Mother    Emphysema Mother    Cancer Father    Colon cancer Sister     SOCIAL HISTORY Social History   Tobacco Use   Smoking status: Never   Smokeless tobacco: Never  Substance Use Topics   Alcohol use: No   Drug use: No         OPHTHALMIC EXAM:  Base Eye Exam     Visual Acuity (ETDRS)       Right Left   Dist [place] 20/40 -2 20/30 -2   Dist ph [place] 20/25 -2 20/20         Tonometry (Tonopen, 1:26 PM)       Right Left   Pressure 12 12         Pupils  Pupils APD   Right PERRL None   Left PERRL None         Visual Fields       Left Right    Full Full         Extraocular Movement       Right Left    Full Full         Neuro/Psych     Oriented x3: Yes   Mood/Affect: Normal         Dilation     Both eyes: 1.0% Mydriacyl, 2.5% Phenylephrine @ 1:25 PM           Slit Lamp and Fundus Exam     External Exam       Right Left   External Normal Normal         Slit Lamp Exam       Right Left   Lids/Lashes Normal Normal   Conjunctiva/Sclera White and quiet White and quiet   Cornea Clear Clear   Anterior Chamber Deep and quiet Deep and quiet   Iris Round and reactive Round and reactive   Lens Posterior chamber intraocular lens Posterior chamber intraocular lens    Anterior Vitreous Normal Normal         Fundus Exam       Right Left   Posterior Vitreous Posterior vitreous detachment    Disc Peripapillary atrophy    C/D Ratio 0.3    Macula Retinal pigment epithelial mottling, no hemorrhage, Hard drusen, pigmentary Macular atrophy, Geographic atrophy, Soft drusen, now with evidence of an RPE rip and scrolled RPE clinically    Vessels Normal    Periphery Normal             IMAGING AND PROCEDURES  Imaging and Procedures for 12/11/21  OCT, Retina - OU - Both Eyes       Right Eye Quality was good. Scan locations included subfoveal. Central Foveal Thickness: 357. Progression has improved. Findings include retinal drusen , subretinal hyper-reflective material, intraretinal fluid, subretinal fluid.   Left Eye Quality was good. Scan locations included subfoveal. Central Foveal Thickness: 246. Progression has been stable. Findings include retinal drusen , vitreomacular adhesion .   Notes OD, today at 5-week interval with decrease in intraretinal fluid and subretinal fluid overlying the PED which may have in fact developed a RPE rip, now confirmed clinically, improved overall at 5-week interval post injection, repeat today to maintain anatomic improvements       Intravitreal Injection, Pharmacologic Agent - OD - Right Eye       Time Out 12/11/2021. 1:44 PM. Confirmed correct patient, procedure, site, and patient consented.   Anesthesia Topical anesthesia was used. Anesthetic medications included Lidocaine 4%.   Procedure Preparation included 5% betadine to ocular surface, 10% betadine to eyelids, Ofloxacin . A 30 gauge needle was used.   Injection: 1.25 mg Bevacizumab 1.'25mg'$ /0.42m   Route: Intravitreal, Site: Right Eye   NDC: 5H061816  Post-op Post injection exam found visual acuity of at least counting fingers. The patient tolerated the procedure well. There were no complications. The patient received written and verbal  post procedure care education. Post injection medications included ocuflox.              ASSESSMENT/PLAN:  Exudative age-related macular degeneration of right eye with active choroidal neovascularization (HCC) Much improved and stable acuity.  From an old RPE rip associated PED from CNVM.  Now with less intraretinal fluid and subretinal fluid on more frequent  injection Avastin, repeat today and maintain follow-up evaluation again in 5 weeks right eye to maintain acuity  Intermediate stage nonexudative age-related macular degeneration of left eye No sign of CNVM OS  Vitreomacular adhesion of left eye Minor OS     ICD-10-CM   1. Exudative age-related macular degeneration of right eye with active choroidal neovascularization (HCC)  H35.3211 OCT, Retina - OU - Both Eyes    Intravitreal Injection, Pharmacologic Agent - OD - Right Eye    Bevacizumab (AVASTIN) SOLN 1.25 mg    2. Intermediate stage nonexudative age-related macular degeneration of left eye  H35.3122     3. Vitreomacular adhesion of left eye  H43.822       1.  OD doing very well with RPE rip, PED.  Stabilized intraretinal fluid and CNVM on 5-week evaluation post antivegF injection.  Repeat injection today maintain evaluation again in 5 weeks  2.  3.  Ophthalmic Meds Ordered this visit:  Meds ordered this encounter  Medications   Bevacizumab (AVASTIN) SOLN 1.25 mg       Return in about 5 weeks (around 01/15/2022) for dilate, OD, AVASTIN OCT.  There are no Patient Instructions on file for this visit.   Explained the diagnoses, plan, and follow up with the patient and they expressed understanding.  Patient expressed understanding of the importance of proper follow up care.   Clent Demark Karsin Pesta M.D. Diseases & Surgery of the Retina and Vitreous Retina & Diabetic [place] 12/11/21     Abbreviations: M myopia (nearsighted); A astigmatism; H hyperopia (farsighted); P presbyopia; Mrx spectacle prescription;   CTL contact lenses; OD right eye; OS left eye; OU both eyes  XT exotropia; ET esotropia; PEK punctate epithelial keratitis; PEE punctate epithelial erosions; DES dry eye syndrome; MGD meibomian gland dysfunction; ATs artificial tears; PFAT's preservative free artificial tears; [place] nuclear sclerotic cataract; PSC posterior subcapsular cataract; ERM epi-retinal membrane; PVD posterior vitreous detachment; RD retinal detachment; DM diabetes mellitus; DR diabetic retinopathy; NPDR non-proliferative diabetic retinopathy; PDR proliferative diabetic retinopathy; CSME clinically significant macular edema; DME diabetic macular edema; dbh dot blot hemorrhages; CWS cotton wool spot; POAG primary open angle glaucoma; C/D cup-to-disc ratio; HVF humphrey visual field; GVF goldmann visual field; OCT optical coherence tomography; IOP intraocular pressure; BRVO Branch retinal vein occlusion; CRVO central retinal vein occlusion; CRAO central retinal artery occlusion; BRAO branch retinal artery occlusion; RT retinal tear; SB scleral buckle; PPV pars plana vitrectomy; VH Vitreous hemorrhage; PRP panretinal laser photocoagulation; IVK intravitreal kenalog; VMT vitreomacular traction; MH Macular hole;  NVD neovascularization of the disc; NVE neovascularization elsewhere; AREDS age related eye disease study; ARMD age related macular degeneration; POAG primary open angle glaucoma; EBMD epithelial/anterior basement membrane dystrophy; ACIOL anterior chamber intraocular lens; IOL intraocular lens; PCIOL posterior chamber intraocular lens; Phaco/IOL phacoemulsification with intraocular lens placement; [place] photorefractive keratectomy; LASIK laser assisted in situ keratomileusis; HTN hypertension; DM diabetes mellitus; COPD chronic obstructive pulmonary disease

## 2021-12-11 NOTE — Assessment & Plan Note (Signed)
No sign of CNVM OS 

## 2021-12-11 NOTE — Assessment & Plan Note (Signed)
Minor OS 

## 2021-12-28 ENCOUNTER — Other Ambulatory Visit: Payer: Self-pay | Admitting: Family Medicine

## 2021-12-28 DIAGNOSIS — Z1231 Encounter for screening mammogram for malignant neoplasm of breast: Secondary | ICD-10-CM

## 2022-01-16 ENCOUNTER — Ambulatory Visit (INDEPENDENT_AMBULATORY_CARE_PROVIDER_SITE_OTHER): Payer: PPO | Admitting: Ophthalmology

## 2022-01-16 ENCOUNTER — Encounter (INDEPENDENT_AMBULATORY_CARE_PROVIDER_SITE_OTHER): Payer: Self-pay | Admitting: Ophthalmology

## 2022-01-16 DIAGNOSIS — H353122 Nonexudative age-related macular degeneration, left eye, intermediate dry stage: Secondary | ICD-10-CM

## 2022-01-16 DIAGNOSIS — H35071 Retinal telangiectasis, right eye: Secondary | ICD-10-CM

## 2022-01-16 DIAGNOSIS — H353113 Nonexudative age-related macular degeneration, right eye, advanced atrophic without subfoveal involvement: Secondary | ICD-10-CM

## 2022-01-16 DIAGNOSIS — H353211 Exudative age-related macular degeneration, right eye, with active choroidal neovascularization: Secondary | ICD-10-CM | POA: Diagnosis not present

## 2022-01-16 MED ORDER — BEVACIZUMAB CHEMO INJECTION 1.25MG/0.05ML SYRINGE FOR KALEIDOSCOPE
1.2500 mg | INTRAVITREAL | Status: AC | PRN
  Administered 2022-01-16: 1.25 mg via INTRAVITREAL

## 2022-01-16 NOTE — Progress Notes (Signed)
01/16/2022     CHIEF COMPLAINT Patient presents for  Chief Complaint  Patient presents with   Macular Degeneration      HISTORY OF PRESENT ILLNESS: Ashley Huang is a 86 y.o. female who presents to the clinic today for:   HPI   5 weeks dilate od avastin oct Pt states her vision has been stable Pt denies any new floaters or FOL Last edited by Morene Rankins, CMA on 01/16/2022  2:36 PM.      Referring physician: Sharilyn Sites, MD [address] [place],  [place] [address]  HISTORICAL INFORMATION:   Selected notes from the [facility]: No current outpatient medications on file. (Ophthalmic Drugs)   No current facility-administered medications for this visit. (Ophthalmic Drugs)   Current Outpatient Medications (Other)  Medication Sig   benzonatate (TESSALON) 100 MG capsule Take 2 capsules (200 mg total) by mouth 3 (three) times daily as needed for cough.   calcium carbonate (OS-CAL) 600 MG TABS Take 600 mg by mouth daily.   cholecalciferol (VITAMIN D) 1000 UNITS tablet Take 1,000 Units by mouth daily.   doxycycline (VIBRAMYCIN) 100 MG capsule Take 1 capsule (100 mg total) by mouth 2 (two) times daily.   mometasone (NASONEX) 50 MCG/ACT nasal spray Place 2 sprays into the nose daily.   montelukast (SINGULAIR) 10 MG tablet Take 10 mg by mouth as needed (Allergies).    Multiple Vitamins-Minerals (PRESERVISION AREDS 2 PO) Take by mouth.   predniSONE (DELTASONE) 10 MG tablet Take by mouth.   No current facility-administered medications for this visit. (Other)      REVIEW OF SYSTEMS: ROS   Negative for: Constitutional, Gastrointestinal, Neurological, Skin, Genitourinary, Musculoskeletal, HENT, Endocrine, Cardiovascular, Eyes, Respiratory, Psychiatric, Allergic/Imm, Heme/Lymph Last edited by Morene Rankins, CMA on 01/16/2022  2:36 PM.       ALLERGIES Allergies  Allergen Reactions   Antihistamines,  Diphenhydramine-Type Anaphylaxis and Swelling   Red Dye Rash   Sulfa Antibiotics Itching and Rash    PAST MEDICAL HISTORY Past Medical History:  Diagnosis Date   Breast cancer ([place]) 1989.   DM (diabetes mellitus) ([place])    type 2-diet controlled   Hypertension    Personal history of radiation therapy    Past Surgical History:  Procedure Laterality Date   BREAST LUMPECTOMY Right    CHOLECYSTECTOMY     COLONOSCOPY N/A 08/08/2012   Procedure: COLONOSCOPY;  Surgeon: Rogene Houston, MD;  [facility];  Service: Endoscopy;  Laterality: N/A;  1015-moved to 9:30 Ann notified pt   TONSILLECTOMY      FAMILY HISTORY Family History  Problem Relation Age of Onset   Hypertension Mother    Emphysema Mother    Cancer Father    Colon cancer Sister     SOCIAL HISTORY Social History   Tobacco Use   Smoking status: Never   Smokeless tobacco: Never  Substance Use Topics   Alcohol use: No   Drug use: No         OPHTHALMIC EXAM:  Base Eye Exam     Visual Acuity (ETDRS)       Right Left   Dist [place] 20/40 20/20   Dist ph [place] 20/30          Tonometry (Tonopen, 2:42 PM)       Right Left   Pressure 10 8         Pupils  Pupils APD   Right PERRL None   Left PERRL None         Visual Fields       Left Right    Full Full         Extraocular Movement       Right Left    Ortho Ortho    -- -- --  --  --  -- -- --   -- -- --  --  --  -- -- --           Neuro/Psych     Oriented x3: Yes   Mood/Affect: Normal         Dilation     Right eye: 2.5% Phenylephrine, 1.0% Mydriacyl @ 2:38 PM           Slit Lamp and Fundus Exam     External Exam       Right Left   External Normal Normal         Slit Lamp Exam       Right Left   Lids/Lashes Normal Normal   Conjunctiva/Sclera White and quiet White and quiet   Cornea Clear Clear   Anterior Chamber Deep and quiet Deep and quiet   Iris Round and reactive Round and reactive    Lens Posterior chamber intraocular lens Posterior chamber intraocular lens   Anterior Vitreous Normal Normal         Fundus Exam       Right Left   Posterior Vitreous Posterior vitreous detachment    Disc Peripapillary atrophy    C/D Ratio 0.3    Macula Retinal pigment epithelial mottling, no hemorrhage, Hard drusen, pigmentary Macular atrophy, Geographic atrophy, Soft drusen, now with evidence of an RPE rip and scrolled RPE clinically    Vessels Normal    Periphery Normal             IMAGING AND PROCEDURES  Imaging and Procedures for 01/16/22  OCT, Retina - OU - Both Eyes       Right Eye Quality was good. Scan locations included subfoveal. Central Foveal Thickness: 357. Progression has improved. Findings include retinal drusen , subretinal hyper-reflective material, intraretinal fluid, subretinal fluid.   Left Eye Quality was good. Scan locations included subfoveal. Central Foveal Thickness: 246. Progression has been stable. Findings include retinal drusen , vitreomacular adhesion .   Notes OD, today at 5-week interval with decrease in intraretinal fluid and subretinal fluid overlying the PED which may have in fact developed a RPE rip, now confirmed clinically, improved overall at 5-week interval post injection, repeat today to maintain anatomic improvements      Intravitreal Injection, Pharmacologic Agent - OD - Right Eye       Time Out 01/16/2022. 3:33 PM. Confirmed correct patient, procedure, site, and patient consented.   Anesthesia Topical anesthesia was used. Anesthetic medications included Lidocaine 4%.   Procedure Preparation included 5% betadine to ocular surface, 10% betadine to eyelids, Ofloxacin . A 30 gauge needle was used.   Injection: 1.25 mg Bevacizumab 1.'25mg'$ /0.81m   Route: Intravitreal, Site: Right Eye   NDC: 5H061816 Lot: [id number] Expiration date: 03/28/2022   Post-op Post injection exam found visual acuity of at least counting  fingers. The patient tolerated the procedure well. There were no complications. The patient received written and verbal post procedure care education. Post injection medications included ocuflox.              ASSESSMENT/PLAN:  Intermediate stage nonexudative age-related macular degeneration  of left eye No sign of CNVM OS  Exudative age-related macular degeneration of right eye with active choroidal neovascularization (HCC) Much less active disease, RPE rip, at 5-week interval.  Repeat injection today to maintain reevaluate next in 8 weeks  Advanced nonexudative age-related macular degeneration of right eye without subfoveal involvement Juxta foveal atrophy stable  Type 2 macular telangiectasis, right Stable overall     ICD-10-CM   1. Exudative age-related macular degeneration of right eye with active choroidal neovascularization (HCC)  H35.3211 OCT, Retina - OU - Both Eyes    Intravitreal Injection, Pharmacologic Agent - OD - Right Eye    Bevacizumab (AVASTIN) SOLN 1.25 mg    2. Intermediate stage nonexudative age-related macular degeneration of left eye  H35.3122     3. Advanced nonexudative age-related macular degeneration of right eye without subfoveal involvement  H35.3113     4. Type 2 macular telangiectasis, right  H35.071       1.  OD condition stabilized and acuity stable.  RPE rip in the past is now stabilized after CNVM and successfully treated.  Residual geographic atrophy remains.  Currently at 5-week interval.  Repeat Avastin today reevaluate next in 8 weeks  2.  OS no sign of CNVM we will continue to monitor and observe dilate OU next  3.  Ophthalmic Meds Ordered this visit:  Meds ordered this encounter  Medications   Bevacizumab (AVASTIN) SOLN 1.25 mg       Return in about 8 weeks (around 03/13/2022) for DILATE OU, AVASTIN OCT, OD.  There are no Patient Instructions on file for this visit.   Explained the diagnoses, plan, and follow up with the  patient and they expressed understanding.  Patient expressed understanding of the importance of proper follow up care.   Clent Demark Marche Hottenstein M.D. Diseases & Surgery of the Retina and Vitreous Retina & Diabetic [place] 01/16/22     Abbreviations: M myopia (nearsighted); A astigmatism; H hyperopia (farsighted); P presbyopia; Mrx spectacle prescription;  CTL contact lenses; OD right eye; OS left eye; OU both eyes  XT exotropia; ET esotropia; PEK punctate epithelial keratitis; PEE punctate epithelial erosions; DES dry eye syndrome; MGD meibomian gland dysfunction; ATs artificial tears; PFAT's preservative free artificial tears; [place] nuclear sclerotic cataract; PSC posterior subcapsular cataract; ERM epi-retinal membrane; PVD posterior vitreous detachment; RD retinal detachment; DM diabetes mellitus; DR diabetic retinopathy; NPDR non-proliferative diabetic retinopathy; PDR proliferative diabetic retinopathy; CSME clinically significant macular edema; DME diabetic macular edema; dbh dot blot hemorrhages; CWS cotton wool spot; POAG primary open angle glaucoma; C/D cup-to-disc ratio; HVF humphrey visual field; GVF goldmann visual field; OCT optical coherence tomography; IOP intraocular pressure; BRVO Branch retinal vein occlusion; CRVO central retinal vein occlusion; CRAO central retinal artery occlusion; BRAO branch retinal artery occlusion; RT retinal tear; SB scleral buckle; PPV pars plana vitrectomy; VH Vitreous hemorrhage; PRP panretinal laser photocoagulation; IVK intravitreal kenalog; VMT vitreomacular traction; MH Macular hole;  NVD neovascularization of the disc; NVE neovascularization elsewhere; AREDS age related eye disease study; ARMD age related macular degeneration; POAG primary open angle glaucoma; EBMD epithelial/anterior basement membrane dystrophy; ACIOL anterior chamber intraocular lens; IOL intraocular lens; PCIOL posterior chamber intraocular lens; Phaco/IOL phacoemulsification with intraocular  lens placement; [place] photorefractive keratectomy; LASIK laser assisted in situ keratomileusis; HTN hypertension; DM diabetes mellitus; COPD chronic obstructive pulmonary disease

## 2022-01-16 NOTE — Assessment & Plan Note (Signed)
No sign of CNVM OS 

## 2022-01-16 NOTE — Assessment & Plan Note (Signed)
Stable overall.  

## 2022-01-16 NOTE — Assessment & Plan Note (Signed)
Juxta foveal atrophy stable

## 2022-01-16 NOTE — Assessment & Plan Note (Signed)
Much less active disease, RPE rip, at 5-week interval.  Repeat injection today to maintain reevaluate next in 8 weeks

## 2022-01-18 ENCOUNTER — Ambulatory Visit
Admission: RE | Admit: 2022-01-18 | Discharge: 2022-01-18 | Disposition: A | Discharge status: Home / Self Care | Payer: PPO | Source: Ambulatory Visit | Attending: Family Medicine | Admitting: Family Medicine

## 2022-01-18 DIAGNOSIS — Z1231 Encounter for screening mammogram for malignant neoplasm of breast: Secondary | ICD-10-CM | POA: Diagnosis not present

## 2022-01-26 DIAGNOSIS — H93293 Other abnormal auditory perceptions, bilateral: Secondary | ICD-10-CM | POA: Diagnosis not present

## 2022-02-12 DIAGNOSIS — G4733 Obstructive sleep apnea (adult) (pediatric): Secondary | ICD-10-CM | POA: Diagnosis not present

## 2022-02-12 DIAGNOSIS — H353211 Exudative age-related macular degeneration, right eye, with active choroidal neovascularization: Secondary | ICD-10-CM | POA: Diagnosis not present

## 2022-02-12 DIAGNOSIS — I10 Essential (primary) hypertension: Secondary | ICD-10-CM | POA: Diagnosis not present

## 2022-02-12 DIAGNOSIS — K219 Gastro-esophageal reflux disease without esophagitis: Secondary | ICD-10-CM | POA: Diagnosis not present

## 2022-02-12 DIAGNOSIS — E119 Type 2 diabetes mellitus without complications: Secondary | ICD-10-CM | POA: Diagnosis not present

## 2022-02-12 DIAGNOSIS — E669 Obesity, unspecified: Secondary | ICD-10-CM | POA: Diagnosis not present

## 2022-02-12 DIAGNOSIS — E559 Vitamin D deficiency, unspecified: Secondary | ICD-10-CM | POA: Diagnosis not present

## 2022-02-12 DIAGNOSIS — H04123 Dry eye syndrome of bilateral lacrimal glands: Secondary | ICD-10-CM | POA: Diagnosis not present

## 2022-02-12 DIAGNOSIS — R32 Unspecified urinary incontinence: Secondary | ICD-10-CM | POA: Diagnosis not present

## 2022-02-12 DIAGNOSIS — J309 Allergic rhinitis, unspecified: Secondary | ICD-10-CM | POA: Diagnosis not present

## 2022-02-12 DIAGNOSIS — M8580 Other specified disorders of bone density and structure, unspecified site: Secondary | ICD-10-CM | POA: Diagnosis not present

## 2022-02-12 DIAGNOSIS — E785 Hyperlipidemia, unspecified: Secondary | ICD-10-CM | POA: Diagnosis not present

## 2022-03-05 DIAGNOSIS — E559 Vitamin D deficiency, unspecified: Secondary | ICD-10-CM | POA: Diagnosis not present

## 2022-03-05 DIAGNOSIS — Z0001 Encounter for general adult medical examination with abnormal findings: Secondary | ICD-10-CM | POA: Diagnosis not present

## 2022-03-05 DIAGNOSIS — K219 Gastro-esophageal reflux disease without esophagitis: Secondary | ICD-10-CM | POA: Diagnosis not present

## 2022-03-05 DIAGNOSIS — Z1331 Encounter for screening for depression: Secondary | ICD-10-CM | POA: Diagnosis not present

## 2022-03-05 DIAGNOSIS — E663 Overweight: Secondary | ICD-10-CM | POA: Diagnosis not present

## 2022-03-05 DIAGNOSIS — E119 Type 2 diabetes mellitus without complications: Secondary | ICD-10-CM | POA: Diagnosis not present

## 2022-03-05 DIAGNOSIS — E785 Hyperlipidemia, unspecified: Secondary | ICD-10-CM | POA: Diagnosis not present

## 2022-03-05 DIAGNOSIS — I10 Essential (primary) hypertension: Secondary | ICD-10-CM | POA: Diagnosis not present

## 2022-03-05 DIAGNOSIS — H3530 Unspecified macular degeneration: Secondary | ICD-10-CM | POA: Diagnosis not present

## 2022-03-05 DIAGNOSIS — Z6826 Body mass index (BMI) 26.0-26.9, adult: Secondary | ICD-10-CM | POA: Diagnosis not present

## 2022-03-12 DIAGNOSIS — G4733 Obstructive sleep apnea (adult) (pediatric): Secondary | ICD-10-CM | POA: Diagnosis not present

## 2022-03-13 ENCOUNTER — Encounter (INDEPENDENT_AMBULATORY_CARE_PROVIDER_SITE_OTHER): Payer: PPO | Admitting: Ophthalmology

## 2022-03-13 DIAGNOSIS — H353122 Nonexudative age-related macular degeneration, left eye, intermediate dry stage: Secondary | ICD-10-CM | POA: Diagnosis not present

## 2022-03-13 DIAGNOSIS — H353211 Exudative age-related macular degeneration, right eye, with active choroidal neovascularization: Secondary | ICD-10-CM | POA: Diagnosis not present

## 2022-03-13 DIAGNOSIS — H35071 Retinal telangiectasis, right eye: Secondary | ICD-10-CM | POA: Diagnosis not present

## 2022-03-13 DIAGNOSIS — H43822 Vitreomacular adhesion, left eye: Secondary | ICD-10-CM | POA: Diagnosis not present

## 2022-03-13 DIAGNOSIS — H353113 Nonexudative age-related macular degeneration, right eye, advanced atrophic without subfoveal involvement: Secondary | ICD-10-CM | POA: Diagnosis not present

## 2022-03-13 DIAGNOSIS — H35351 Cystoid macular degeneration, right eye: Secondary | ICD-10-CM | POA: Diagnosis not present

## 2022-03-15 DIAGNOSIS — Z23 Encounter for immunization: Secondary | ICD-10-CM | POA: Diagnosis not present

## 2022-05-10 DIAGNOSIS — H35071 Retinal telangiectasis, right eye: Secondary | ICD-10-CM | POA: Diagnosis not present

## 2022-05-10 DIAGNOSIS — H353122 Nonexudative age-related macular degeneration, left eye, intermediate dry stage: Secondary | ICD-10-CM | POA: Diagnosis not present

## 2022-05-10 DIAGNOSIS — H353113 Nonexudative age-related macular degeneration, right eye, advanced atrophic without subfoveal involvement: Secondary | ICD-10-CM | POA: Diagnosis not present

## 2022-05-10 DIAGNOSIS — H43822 Vitreomacular adhesion, left eye: Secondary | ICD-10-CM | POA: Diagnosis not present

## 2022-05-10 DIAGNOSIS — H353211 Exudative age-related macular degeneration, right eye, with active choroidal neovascularization: Secondary | ICD-10-CM | POA: Diagnosis not present

## 2022-05-10 DIAGNOSIS — H35351 Cystoid macular degeneration, right eye: Secondary | ICD-10-CM | POA: Diagnosis not present

## 2022-05-21 DIAGNOSIS — Z23 Encounter for immunization: Secondary | ICD-10-CM | POA: Diagnosis not present

## 2022-07-05 DIAGNOSIS — H43822 Vitreomacular adhesion, left eye: Secondary | ICD-10-CM | POA: Diagnosis not present

## 2022-07-05 DIAGNOSIS — H353122 Nonexudative age-related macular degeneration, left eye, intermediate dry stage: Secondary | ICD-10-CM | POA: Diagnosis not present

## 2022-07-05 DIAGNOSIS — H353211 Exudative age-related macular degeneration, right eye, with active choroidal neovascularization: Secondary | ICD-10-CM | POA: Diagnosis not present

## 2022-07-10 DIAGNOSIS — E663 Overweight: Secondary | ICD-10-CM | POA: Diagnosis not present

## 2022-07-10 DIAGNOSIS — J309 Allergic rhinitis, unspecified: Secondary | ICD-10-CM | POA: Diagnosis not present

## 2022-07-10 DIAGNOSIS — Z6826 Body mass index (BMI) 26.0-26.9, adult: Secondary | ICD-10-CM | POA: Diagnosis not present

## 2022-07-19 DIAGNOSIS — I10 Essential (primary) hypertension: Secondary | ICD-10-CM | POA: Diagnosis not present

## 2022-08-16 DIAGNOSIS — H3561 Retinal hemorrhage, right eye: Secondary | ICD-10-CM | POA: Diagnosis not present

## 2022-08-16 DIAGNOSIS — H353211 Exudative age-related macular degeneration, right eye, with active choroidal neovascularization: Secondary | ICD-10-CM | POA: Diagnosis not present

## 2022-08-16 DIAGNOSIS — H353113 Nonexudative age-related macular degeneration, right eye, advanced atrophic without subfoveal involvement: Secondary | ICD-10-CM | POA: Diagnosis not present

## 2022-08-16 DIAGNOSIS — H35351 Cystoid macular degeneration, right eye: Secondary | ICD-10-CM | POA: Diagnosis not present

## 2022-08-16 DIAGNOSIS — H35071 Retinal telangiectasis, right eye: Secondary | ICD-10-CM | POA: Diagnosis not present

## 2022-08-16 DIAGNOSIS — H43822 Vitreomacular adhesion, left eye: Secondary | ICD-10-CM | POA: Diagnosis not present

## 2022-08-16 DIAGNOSIS — H353122 Nonexudative age-related macular degeneration, left eye, intermediate dry stage: Secondary | ICD-10-CM | POA: Diagnosis not present

## 2022-08-22 DIAGNOSIS — Z86018 Personal history of other benign neoplasm: Secondary | ICD-10-CM | POA: Diagnosis not present

## 2022-08-22 DIAGNOSIS — L578 Other skin changes due to chronic exposure to nonionizing radiation: Secondary | ICD-10-CM | POA: Diagnosis not present

## 2022-08-22 DIAGNOSIS — D2239 Melanocytic nevi of other parts of face: Secondary | ICD-10-CM | POA: Diagnosis not present

## 2022-08-22 DIAGNOSIS — L570 Actinic keratosis: Secondary | ICD-10-CM | POA: Diagnosis not present

## 2022-08-22 DIAGNOSIS — D225 Melanocytic nevi of trunk: Secondary | ICD-10-CM | POA: Diagnosis not present

## 2022-08-22 DIAGNOSIS — D2272 Melanocytic nevi of left lower limb, including hip: Secondary | ICD-10-CM | POA: Diagnosis not present

## 2022-08-22 DIAGNOSIS — C4441 Basal cell carcinoma of skin of scalp and neck: Secondary | ICD-10-CM | POA: Diagnosis not present

## 2022-08-22 DIAGNOSIS — C44712 Basal cell carcinoma of skin of right lower limb, including hip: Secondary | ICD-10-CM | POA: Diagnosis not present

## 2022-08-22 DIAGNOSIS — D2230 Melanocytic nevi of unspecified part of face: Secondary | ICD-10-CM | POA: Diagnosis not present

## 2022-08-22 DIAGNOSIS — Z85828 Personal history of other malignant neoplasm of skin: Secondary | ICD-10-CM | POA: Diagnosis not present

## 2022-08-22 DIAGNOSIS — C4442 Squamous cell carcinoma of skin of scalp and neck: Secondary | ICD-10-CM | POA: Diagnosis not present

## 2022-08-22 DIAGNOSIS — D485 Neoplasm of uncertain behavior of skin: Secondary | ICD-10-CM | POA: Diagnosis not present

## 2022-08-22 DIAGNOSIS — D2261 Melanocytic nevi of right upper limb, including shoulder: Secondary | ICD-10-CM | POA: Diagnosis not present

## 2022-08-28 DIAGNOSIS — E663 Overweight: Secondary | ICD-10-CM | POA: Diagnosis not present

## 2022-08-28 DIAGNOSIS — Z6825 Body mass index (BMI) 25.0-25.9, adult: Secondary | ICD-10-CM | POA: Diagnosis not present

## 2022-08-28 DIAGNOSIS — J309 Allergic rhinitis, unspecified: Secondary | ICD-10-CM | POA: Diagnosis not present

## 2022-08-28 DIAGNOSIS — I10 Essential (primary) hypertension: Secondary | ICD-10-CM | POA: Diagnosis not present

## 2022-08-28 DIAGNOSIS — E119 Type 2 diabetes mellitus without complications: Secondary | ICD-10-CM | POA: Diagnosis not present

## 2022-08-28 DIAGNOSIS — H3530 Unspecified macular degeneration: Secondary | ICD-10-CM | POA: Diagnosis not present

## 2022-08-28 DIAGNOSIS — E785 Hyperlipidemia, unspecified: Secondary | ICD-10-CM | POA: Diagnosis not present

## 2022-09-27 DIAGNOSIS — H43822 Vitreomacular adhesion, left eye: Secondary | ICD-10-CM | POA: Diagnosis not present

## 2022-09-27 DIAGNOSIS — H35071 Retinal telangiectasis, right eye: Secondary | ICD-10-CM | POA: Diagnosis not present

## 2022-09-27 DIAGNOSIS — H353211 Exudative age-related macular degeneration, right eye, with active choroidal neovascularization: Secondary | ICD-10-CM | POA: Diagnosis not present

## 2022-09-27 DIAGNOSIS — H3561 Retinal hemorrhage, right eye: Secondary | ICD-10-CM | POA: Diagnosis not present

## 2022-09-27 DIAGNOSIS — H353122 Nonexudative age-related macular degeneration, left eye, intermediate dry stage: Secondary | ICD-10-CM | POA: Diagnosis not present

## 2022-09-27 DIAGNOSIS — H35351 Cystoid macular degeneration, right eye: Secondary | ICD-10-CM | POA: Diagnosis not present

## 2022-09-27 DIAGNOSIS — H353113 Nonexudative age-related macular degeneration, right eye, advanced atrophic without subfoveal involvement: Secondary | ICD-10-CM | POA: Diagnosis not present

## 2022-10-01 DIAGNOSIS — C4442 Squamous cell carcinoma of skin of scalp and neck: Secondary | ICD-10-CM | POA: Diagnosis not present

## 2022-10-22 DIAGNOSIS — C44712 Basal cell carcinoma of skin of right lower limb, including hip: Secondary | ICD-10-CM | POA: Diagnosis not present

## 2022-11-08 DIAGNOSIS — H43822 Vitreomacular adhesion, left eye: Secondary | ICD-10-CM | POA: Diagnosis not present

## 2022-11-08 DIAGNOSIS — H353211 Exudative age-related macular degeneration, right eye, with active choroidal neovascularization: Secondary | ICD-10-CM | POA: Diagnosis not present

## 2022-11-08 DIAGNOSIS — H35071 Retinal telangiectasis, right eye: Secondary | ICD-10-CM | POA: Diagnosis not present

## 2022-11-08 DIAGNOSIS — H3561 Retinal hemorrhage, right eye: Secondary | ICD-10-CM | POA: Diagnosis not present

## 2022-11-08 DIAGNOSIS — H353122 Nonexudative age-related macular degeneration, left eye, intermediate dry stage: Secondary | ICD-10-CM | POA: Diagnosis not present

## 2022-11-08 DIAGNOSIS — H353113 Nonexudative age-related macular degeneration, right eye, advanced atrophic without subfoveal involvement: Secondary | ICD-10-CM | POA: Diagnosis not present

## 2022-11-08 DIAGNOSIS — H35351 Cystoid macular degeneration, right eye: Secondary | ICD-10-CM | POA: Diagnosis not present

## 2022-12-18 DIAGNOSIS — L820 Inflamed seborrheic keratosis: Secondary | ICD-10-CM | POA: Diagnosis not present

## 2022-12-18 DIAGNOSIS — C4441 Basal cell carcinoma of skin of scalp and neck: Secondary | ICD-10-CM | POA: Diagnosis not present

## 2022-12-25 DIAGNOSIS — H353122 Nonexudative age-related macular degeneration, left eye, intermediate dry stage: Secondary | ICD-10-CM | POA: Diagnosis not present

## 2022-12-25 DIAGNOSIS — H353211 Exudative age-related macular degeneration, right eye, with active choroidal neovascularization: Secondary | ICD-10-CM | POA: Diagnosis not present

## 2022-12-25 DIAGNOSIS — H35071 Retinal telangiectasis, right eye: Secondary | ICD-10-CM | POA: Diagnosis not present

## 2022-12-25 DIAGNOSIS — H353113 Nonexudative age-related macular degeneration, right eye, advanced atrophic without subfoveal involvement: Secondary | ICD-10-CM | POA: Diagnosis not present

## 2022-12-25 DIAGNOSIS — H3561 Retinal hemorrhage, right eye: Secondary | ICD-10-CM | POA: Diagnosis not present

## 2022-12-25 DIAGNOSIS — H35351 Cystoid macular degeneration, right eye: Secondary | ICD-10-CM | POA: Diagnosis not present

## 2022-12-25 DIAGNOSIS — H43822 Vitreomacular adhesion, left eye: Secondary | ICD-10-CM | POA: Diagnosis not present

## 2022-12-27 ENCOUNTER — Other Ambulatory Visit: Payer: Self-pay | Admitting: Family Medicine

## 2022-12-27 DIAGNOSIS — J45909 Unspecified asthma, uncomplicated: Secondary | ICD-10-CM | POA: Diagnosis not present

## 2022-12-27 DIAGNOSIS — E663 Overweight: Secondary | ICD-10-CM | POA: Diagnosis not present

## 2022-12-27 DIAGNOSIS — R32 Unspecified urinary incontinence: Secondary | ICD-10-CM | POA: Diagnosis not present

## 2022-12-27 DIAGNOSIS — H353211 Exudative age-related macular degeneration, right eye, with active choroidal neovascularization: Secondary | ICD-10-CM | POA: Diagnosis not present

## 2022-12-27 DIAGNOSIS — I10 Essential (primary) hypertension: Secondary | ICD-10-CM | POA: Diagnosis not present

## 2022-12-27 DIAGNOSIS — E559 Vitamin D deficiency, unspecified: Secondary | ICD-10-CM | POA: Diagnosis not present

## 2022-12-27 DIAGNOSIS — C50119 Malignant neoplasm of central portion of unspecified female breast: Secondary | ICD-10-CM | POA: Diagnosis not present

## 2022-12-27 DIAGNOSIS — G4733 Obstructive sleep apnea (adult) (pediatric): Secondary | ICD-10-CM | POA: Diagnosis not present

## 2022-12-27 DIAGNOSIS — Z9849 Cataract extraction status, unspecified eye: Secondary | ICD-10-CM | POA: Diagnosis not present

## 2022-12-27 DIAGNOSIS — M8580 Other specified disorders of bone density and structure, unspecified site: Secondary | ICD-10-CM | POA: Diagnosis not present

## 2022-12-27 DIAGNOSIS — Z1231 Encounter for screening mammogram for malignant neoplasm of breast: Secondary | ICD-10-CM

## 2023-01-01 DIAGNOSIS — E119 Type 2 diabetes mellitus without complications: Secondary | ICD-10-CM | POA: Diagnosis not present

## 2023-01-01 DIAGNOSIS — C50119 Malignant neoplasm of central portion of unspecified female breast: Secondary | ICD-10-CM | POA: Diagnosis not present

## 2023-01-01 DIAGNOSIS — Z6825 Body mass index (BMI) 25.0-25.9, adult: Secondary | ICD-10-CM | POA: Diagnosis not present

## 2023-01-01 DIAGNOSIS — E663 Overweight: Secondary | ICD-10-CM | POA: Diagnosis not present

## 2023-01-07 DIAGNOSIS — H16143 Punctate keratitis, bilateral: Secondary | ICD-10-CM | POA: Diagnosis not present

## 2023-01-07 DIAGNOSIS — H04123 Dry eye syndrome of bilateral lacrimal glands: Secondary | ICD-10-CM | POA: Diagnosis not present

## 2023-01-07 DIAGNOSIS — H43393 Other vitreous opacities, bilateral: Secondary | ICD-10-CM | POA: Diagnosis not present

## 2023-01-07 DIAGNOSIS — H353212 Exudative age-related macular degeneration, right eye, with inactive choroidal neovascularization: Secondary | ICD-10-CM | POA: Diagnosis not present

## 2023-01-07 DIAGNOSIS — H353122 Nonexudative age-related macular degeneration, left eye, intermediate dry stage: Secondary | ICD-10-CM | POA: Diagnosis not present

## 2023-01-07 DIAGNOSIS — E119 Type 2 diabetes mellitus without complications: Secondary | ICD-10-CM | POA: Diagnosis not present

## 2023-01-07 DIAGNOSIS — H02834 Dermatochalasis of left upper eyelid: Secondary | ICD-10-CM | POA: Diagnosis not present

## 2023-01-07 DIAGNOSIS — H1045 Other chronic allergic conjunctivitis: Secondary | ICD-10-CM | POA: Diagnosis not present

## 2023-01-07 DIAGNOSIS — H18513 Endothelial corneal dystrophy, bilateral: Secondary | ICD-10-CM | POA: Diagnosis not present

## 2023-01-07 DIAGNOSIS — H02831 Dermatochalasis of right upper eyelid: Secondary | ICD-10-CM | POA: Diagnosis not present

## 2023-01-07 DIAGNOSIS — H0102A Squamous blepharitis right eye, upper and lower eyelids: Secondary | ICD-10-CM | POA: Diagnosis not present

## 2023-01-15 DIAGNOSIS — H903 Sensorineural hearing loss, bilateral: Secondary | ICD-10-CM | POA: Diagnosis not present

## 2023-01-22 ENCOUNTER — Ambulatory Visit
Admission: RE | Admit: 2023-01-22 | Discharge: 2023-01-22 | Disposition: A | Discharge status: Home / Self Care | Payer: PPO | Source: Ambulatory Visit | Attending: Family Medicine | Admitting: Family Medicine

## 2023-01-22 DIAGNOSIS — Z1231 Encounter for screening mammogram for malignant neoplasm of breast: Secondary | ICD-10-CM | POA: Diagnosis not present

## 2023-02-05 DIAGNOSIS — H353211 Exudative age-related macular degeneration, right eye, with active choroidal neovascularization: Secondary | ICD-10-CM | POA: Diagnosis not present

## 2023-02-05 DIAGNOSIS — H353113 Nonexudative age-related macular degeneration, right eye, advanced atrophic without subfoveal involvement: Secondary | ICD-10-CM | POA: Diagnosis not present

## 2023-02-05 DIAGNOSIS — H43823 Vitreomacular adhesion, bilateral: Secondary | ICD-10-CM | POA: Diagnosis not present

## 2023-02-05 DIAGNOSIS — H3561 Retinal hemorrhage, right eye: Secondary | ICD-10-CM | POA: Diagnosis not present

## 2023-02-05 DIAGNOSIS — H353122 Nonexudative age-related macular degeneration, left eye, intermediate dry stage: Secondary | ICD-10-CM | POA: Diagnosis not present

## 2023-02-05 DIAGNOSIS — H35071 Retinal telangiectasis, right eye: Secondary | ICD-10-CM | POA: Diagnosis not present

## 2023-02-05 DIAGNOSIS — H35351 Cystoid macular degeneration, right eye: Secondary | ICD-10-CM | POA: Diagnosis not present

## 2023-02-15 DIAGNOSIS — H903 Sensorineural hearing loss, bilateral: Secondary | ICD-10-CM | POA: Diagnosis not present

## 2023-03-18 DIAGNOSIS — E663 Overweight: Secondary | ICD-10-CM | POA: Diagnosis not present

## 2023-03-18 DIAGNOSIS — Z1331 Encounter for screening for depression: Secondary | ICD-10-CM | POA: Diagnosis not present

## 2023-03-18 DIAGNOSIS — E559 Vitamin D deficiency, unspecified: Secondary | ICD-10-CM | POA: Diagnosis not present

## 2023-03-18 DIAGNOSIS — R7309 Other abnormal glucose: Secondary | ICD-10-CM | POA: Diagnosis not present

## 2023-03-18 DIAGNOSIS — Z0001 Encounter for general adult medical examination with abnormal findings: Secondary | ICD-10-CM | POA: Diagnosis not present

## 2023-03-18 DIAGNOSIS — Z6826 Body mass index (BMI) 26.0-26.9, adult: Secondary | ICD-10-CM | POA: Diagnosis not present

## 2023-03-18 DIAGNOSIS — E782 Mixed hyperlipidemia: Secondary | ICD-10-CM | POA: Diagnosis not present

## 2023-03-19 DIAGNOSIS — H35071 Retinal telangiectasis, right eye: Secondary | ICD-10-CM | POA: Diagnosis not present

## 2023-03-19 DIAGNOSIS — H3561 Retinal hemorrhage, right eye: Secondary | ICD-10-CM | POA: Diagnosis not present

## 2023-03-19 DIAGNOSIS — H353211 Exudative age-related macular degeneration, right eye, with active choroidal neovascularization: Secondary | ICD-10-CM | POA: Diagnosis not present

## 2023-03-19 DIAGNOSIS — H35351 Cystoid macular degeneration, right eye: Secondary | ICD-10-CM | POA: Diagnosis not present

## 2023-03-19 DIAGNOSIS — H43823 Vitreomacular adhesion, bilateral: Secondary | ICD-10-CM | POA: Diagnosis not present

## 2023-03-19 DIAGNOSIS — H353122 Nonexudative age-related macular degeneration, left eye, intermediate dry stage: Secondary | ICD-10-CM | POA: Diagnosis not present

## 2023-03-19 DIAGNOSIS — H353113 Nonexudative age-related macular degeneration, right eye, advanced atrophic without subfoveal involvement: Secondary | ICD-10-CM | POA: Diagnosis not present

## 2023-03-21 DIAGNOSIS — E7849 Other hyperlipidemia: Secondary | ICD-10-CM | POA: Diagnosis not present

## 2023-03-21 DIAGNOSIS — Z0001 Encounter for general adult medical examination with abnormal findings: Secondary | ICD-10-CM | POA: Diagnosis not present

## 2023-03-21 DIAGNOSIS — E119 Type 2 diabetes mellitus without complications: Secondary | ICD-10-CM | POA: Diagnosis not present

## 2023-03-21 DIAGNOSIS — E559 Vitamin D deficiency, unspecified: Secondary | ICD-10-CM | POA: Diagnosis not present

## 2023-04-30 DIAGNOSIS — H43821 Vitreomacular adhesion, right eye: Secondary | ICD-10-CM | POA: Diagnosis not present

## 2023-04-30 DIAGNOSIS — H353122 Nonexudative age-related macular degeneration, left eye, intermediate dry stage: Secondary | ICD-10-CM | POA: Diagnosis not present

## 2023-04-30 DIAGNOSIS — H353113 Nonexudative age-related macular degeneration, right eye, advanced atrophic without subfoveal involvement: Secondary | ICD-10-CM | POA: Diagnosis not present

## 2023-04-30 DIAGNOSIS — H43822 Vitreomacular adhesion, left eye: Secondary | ICD-10-CM | POA: Diagnosis not present

## 2023-04-30 DIAGNOSIS — H35071 Retinal telangiectasis, right eye: Secondary | ICD-10-CM | POA: Diagnosis not present

## 2023-04-30 DIAGNOSIS — H3561 Retinal hemorrhage, right eye: Secondary | ICD-10-CM | POA: Diagnosis not present

## 2023-04-30 DIAGNOSIS — H35351 Cystoid macular degeneration, right eye: Secondary | ICD-10-CM | POA: Diagnosis not present

## 2023-04-30 DIAGNOSIS — H353211 Exudative age-related macular degeneration, right eye, with active choroidal neovascularization: Secondary | ICD-10-CM | POA: Diagnosis not present

## 2023-06-11 DIAGNOSIS — H353211 Exudative age-related macular degeneration, right eye, with active choroidal neovascularization: Secondary | ICD-10-CM | POA: Diagnosis not present

## 2023-06-11 DIAGNOSIS — H35351 Cystoid macular degeneration, right eye: Secondary | ICD-10-CM | POA: Diagnosis not present

## 2023-06-11 DIAGNOSIS — H35071 Retinal telangiectasis, right eye: Secondary | ICD-10-CM | POA: Diagnosis not present

## 2023-06-11 DIAGNOSIS — H353113 Nonexudative age-related macular degeneration, right eye, advanced atrophic without subfoveal involvement: Secondary | ICD-10-CM | POA: Diagnosis not present

## 2023-06-11 DIAGNOSIS — H3561 Retinal hemorrhage, right eye: Secondary | ICD-10-CM | POA: Diagnosis not present

## 2023-06-11 DIAGNOSIS — H43822 Vitreomacular adhesion, left eye: Secondary | ICD-10-CM | POA: Diagnosis not present

## 2023-06-11 DIAGNOSIS — H43821 Vitreomacular adhesion, right eye: Secondary | ICD-10-CM | POA: Diagnosis not present

## 2023-06-11 DIAGNOSIS — H353122 Nonexudative age-related macular degeneration, left eye, intermediate dry stage: Secondary | ICD-10-CM | POA: Diagnosis not present

## 2023-07-23 DIAGNOSIS — H353122 Nonexudative age-related macular degeneration, left eye, intermediate dry stage: Secondary | ICD-10-CM | POA: Diagnosis not present

## 2023-07-23 DIAGNOSIS — H35351 Cystoid macular degeneration, right eye: Secondary | ICD-10-CM | POA: Diagnosis not present

## 2023-07-23 DIAGNOSIS — H353211 Exudative age-related macular degeneration, right eye, with active choroidal neovascularization: Secondary | ICD-10-CM | POA: Diagnosis not present

## 2023-07-23 DIAGNOSIS — H3561 Retinal hemorrhage, right eye: Secondary | ICD-10-CM | POA: Diagnosis not present

## 2023-07-23 DIAGNOSIS — H35071 Retinal telangiectasis, right eye: Secondary | ICD-10-CM | POA: Diagnosis not present

## 2023-07-23 DIAGNOSIS — H43821 Vitreomacular adhesion, right eye: Secondary | ICD-10-CM | POA: Diagnosis not present

## 2023-07-23 DIAGNOSIS — H353113 Nonexudative age-related macular degeneration, right eye, advanced atrophic without subfoveal involvement: Secondary | ICD-10-CM | POA: Diagnosis not present

## 2023-07-23 DIAGNOSIS — H43822 Vitreomacular adhesion, left eye: Secondary | ICD-10-CM | POA: Diagnosis not present

## 2023-09-03 DIAGNOSIS — H35071 Retinal telangiectasis, right eye: Secondary | ICD-10-CM | POA: Diagnosis not present

## 2023-09-03 DIAGNOSIS — H353113 Nonexudative age-related macular degeneration, right eye, advanced atrophic without subfoveal involvement: Secondary | ICD-10-CM | POA: Diagnosis not present

## 2023-09-03 DIAGNOSIS — H3561 Retinal hemorrhage, right eye: Secondary | ICD-10-CM | POA: Diagnosis not present

## 2023-09-03 DIAGNOSIS — H35351 Cystoid macular degeneration, right eye: Secondary | ICD-10-CM | POA: Diagnosis not present

## 2023-09-03 DIAGNOSIS — H43822 Vitreomacular adhesion, left eye: Secondary | ICD-10-CM | POA: Diagnosis not present

## 2023-09-03 DIAGNOSIS — H353122 Nonexudative age-related macular degeneration, left eye, intermediate dry stage: Secondary | ICD-10-CM | POA: Diagnosis not present

## 2023-09-03 DIAGNOSIS — H43821 Vitreomacular adhesion, right eye: Secondary | ICD-10-CM | POA: Diagnosis not present

## 2023-09-03 DIAGNOSIS — H353211 Exudative age-related macular degeneration, right eye, with active choroidal neovascularization: Secondary | ICD-10-CM | POA: Diagnosis not present

## 2023-09-11 DIAGNOSIS — L820 Inflamed seborrheic keratosis: Secondary | ICD-10-CM | POA: Diagnosis not present

## 2023-09-11 DIAGNOSIS — L570 Actinic keratosis: Secondary | ICD-10-CM | POA: Diagnosis not present

## 2023-09-11 DIAGNOSIS — M6740 Ganglion, unspecified site: Secondary | ICD-10-CM | POA: Diagnosis not present

## 2023-09-11 DIAGNOSIS — Z86018 Personal history of other benign neoplasm: Secondary | ICD-10-CM | POA: Diagnosis not present

## 2023-09-11 DIAGNOSIS — D2230 Melanocytic nevi of unspecified part of face: Secondary | ICD-10-CM | POA: Diagnosis not present

## 2023-09-11 DIAGNOSIS — D044 Carcinoma in situ of skin of scalp and neck: Secondary | ICD-10-CM | POA: Diagnosis not present

## 2023-09-11 DIAGNOSIS — D2239 Melanocytic nevi of other parts of face: Secondary | ICD-10-CM | POA: Diagnosis not present

## 2023-09-11 DIAGNOSIS — L578 Other skin changes due to chronic exposure to nonionizing radiation: Secondary | ICD-10-CM | POA: Diagnosis not present

## 2023-09-11 DIAGNOSIS — D225 Melanocytic nevi of trunk: Secondary | ICD-10-CM | POA: Diagnosis not present

## 2023-09-11 DIAGNOSIS — D2272 Melanocytic nevi of left lower limb, including hip: Secondary | ICD-10-CM | POA: Diagnosis not present

## 2023-09-11 DIAGNOSIS — Z85828 Personal history of other malignant neoplasm of skin: Secondary | ICD-10-CM | POA: Diagnosis not present

## 2023-09-11 DIAGNOSIS — D485 Neoplasm of uncertain behavior of skin: Secondary | ICD-10-CM | POA: Diagnosis not present

## 2023-09-11 DIAGNOSIS — D2261 Melanocytic nevi of right upper limb, including shoulder: Secondary | ICD-10-CM | POA: Diagnosis not present

## 2023-09-16 DIAGNOSIS — E782 Mixed hyperlipidemia: Secondary | ICD-10-CM | POA: Diagnosis not present

## 2023-09-16 DIAGNOSIS — Z6825 Body mass index (BMI) 25.0-25.9, adult: Secondary | ICD-10-CM | POA: Diagnosis not present

## 2023-09-16 DIAGNOSIS — R7309 Other abnormal glucose: Secondary | ICD-10-CM | POA: Diagnosis not present

## 2023-09-16 DIAGNOSIS — E119 Type 2 diabetes mellitus without complications: Secondary | ICD-10-CM | POA: Diagnosis not present

## 2023-09-16 DIAGNOSIS — E559 Vitamin D deficiency, unspecified: Secondary | ICD-10-CM | POA: Diagnosis not present

## 2023-09-16 DIAGNOSIS — E663 Overweight: Secondary | ICD-10-CM | POA: Diagnosis not present

## 2023-09-16 DIAGNOSIS — E7849 Other hyperlipidemia: Secondary | ICD-10-CM | POA: Diagnosis not present

## 2023-10-10 DIAGNOSIS — C4442 Squamous cell carcinoma of skin of scalp and neck: Secondary | ICD-10-CM | POA: Diagnosis not present

## 2023-10-15 DIAGNOSIS — H43822 Vitreomacular adhesion, left eye: Secondary | ICD-10-CM | POA: Diagnosis not present

## 2023-10-15 DIAGNOSIS — H43821 Vitreomacular adhesion, right eye: Secondary | ICD-10-CM | POA: Diagnosis not present

## 2023-10-15 DIAGNOSIS — H353113 Nonexudative age-related macular degeneration, right eye, advanced atrophic without subfoveal involvement: Secondary | ICD-10-CM | POA: Diagnosis not present

## 2023-10-15 DIAGNOSIS — H35071 Retinal telangiectasis, right eye: Secondary | ICD-10-CM | POA: Diagnosis not present

## 2023-10-15 DIAGNOSIS — H3561 Retinal hemorrhage, right eye: Secondary | ICD-10-CM | POA: Diagnosis not present

## 2023-10-15 DIAGNOSIS — H35351 Cystoid macular degeneration, right eye: Secondary | ICD-10-CM | POA: Diagnosis not present

## 2023-10-15 DIAGNOSIS — H353122 Nonexudative age-related macular degeneration, left eye, intermediate dry stage: Secondary | ICD-10-CM | POA: Diagnosis not present

## 2023-10-15 DIAGNOSIS — H353211 Exudative age-related macular degeneration, right eye, with active choroidal neovascularization: Secondary | ICD-10-CM | POA: Diagnosis not present

## 2023-11-06 DIAGNOSIS — E663 Overweight: Secondary | ICD-10-CM | POA: Diagnosis not present

## 2023-11-06 DIAGNOSIS — Z6825 Body mass index (BMI) 25.0-25.9, adult: Secondary | ICD-10-CM | POA: Diagnosis not present

## 2023-11-06 DIAGNOSIS — H6501 Acute serous otitis media, right ear: Secondary | ICD-10-CM | POA: Diagnosis not present

## 2023-11-06 DIAGNOSIS — R590 Localized enlarged lymph nodes: Secondary | ICD-10-CM | POA: Diagnosis not present

## 2023-11-26 DIAGNOSIS — H353122 Nonexudative age-related macular degeneration, left eye, intermediate dry stage: Secondary | ICD-10-CM | POA: Diagnosis not present

## 2023-11-26 DIAGNOSIS — H43822 Vitreomacular adhesion, left eye: Secondary | ICD-10-CM | POA: Diagnosis not present

## 2023-11-26 DIAGNOSIS — H35351 Cystoid macular degeneration, right eye: Secondary | ICD-10-CM | POA: Diagnosis not present

## 2023-11-26 DIAGNOSIS — H353113 Nonexudative age-related macular degeneration, right eye, advanced atrophic without subfoveal involvement: Secondary | ICD-10-CM | POA: Diagnosis not present

## 2023-11-26 DIAGNOSIS — H3561 Retinal hemorrhage, right eye: Secondary | ICD-10-CM | POA: Diagnosis not present

## 2023-11-26 DIAGNOSIS — H43821 Vitreomacular adhesion, right eye: Secondary | ICD-10-CM | POA: Diagnosis not present

## 2023-11-26 DIAGNOSIS — H353211 Exudative age-related macular degeneration, right eye, with active choroidal neovascularization: Secondary | ICD-10-CM | POA: Diagnosis not present

## 2023-11-26 DIAGNOSIS — H35071 Retinal telangiectasis, right eye: Secondary | ICD-10-CM | POA: Diagnosis not present

## 2024-01-07 DIAGNOSIS — H353113 Nonexudative age-related macular degeneration, right eye, advanced atrophic without subfoveal involvement: Secondary | ICD-10-CM | POA: Diagnosis not present

## 2024-01-07 DIAGNOSIS — H35351 Cystoid macular degeneration, right eye: Secondary | ICD-10-CM | POA: Diagnosis not present

## 2024-01-07 DIAGNOSIS — H3561 Retinal hemorrhage, right eye: Secondary | ICD-10-CM | POA: Diagnosis not present

## 2024-01-07 DIAGNOSIS — H353211 Exudative age-related macular degeneration, right eye, with active choroidal neovascularization: Secondary | ICD-10-CM | POA: Diagnosis not present

## 2024-01-07 DIAGNOSIS — H43823 Vitreomacular adhesion, bilateral: Secondary | ICD-10-CM | POA: Diagnosis not present

## 2024-01-07 DIAGNOSIS — H353122 Nonexudative age-related macular degeneration, left eye, intermediate dry stage: Secondary | ICD-10-CM | POA: Diagnosis not present

## 2024-01-07 DIAGNOSIS — H35071 Retinal telangiectasis, right eye: Secondary | ICD-10-CM | POA: Diagnosis not present

## 2024-02-18 DIAGNOSIS — H3561 Retinal hemorrhage, right eye: Secondary | ICD-10-CM | POA: Diagnosis not present

## 2024-02-18 DIAGNOSIS — H353122 Nonexudative age-related macular degeneration, left eye, intermediate dry stage: Secondary | ICD-10-CM | POA: Diagnosis not present

## 2024-02-18 DIAGNOSIS — H353211 Exudative age-related macular degeneration, right eye, with active choroidal neovascularization: Secondary | ICD-10-CM | POA: Diagnosis not present

## 2024-02-18 DIAGNOSIS — H353113 Nonexudative age-related macular degeneration, right eye, advanced atrophic without subfoveal involvement: Secondary | ICD-10-CM | POA: Diagnosis not present

## 2024-02-18 DIAGNOSIS — H35351 Cystoid macular degeneration, right eye: Secondary | ICD-10-CM | POA: Diagnosis not present

## 2024-02-18 DIAGNOSIS — H43822 Vitreomacular adhesion, left eye: Secondary | ICD-10-CM | POA: Diagnosis not present

## 2024-02-18 DIAGNOSIS — H43821 Vitreomacular adhesion, right eye: Secondary | ICD-10-CM | POA: Diagnosis not present

## 2024-02-18 DIAGNOSIS — H35071 Retinal telangiectasis, right eye: Secondary | ICD-10-CM | POA: Diagnosis not present

## 2024-03-31 DIAGNOSIS — H3561 Retinal hemorrhage, right eye: Secondary | ICD-10-CM | POA: Diagnosis not present

## 2024-03-31 DIAGNOSIS — H353211 Exudative age-related macular degeneration, right eye, with active choroidal neovascularization: Secondary | ICD-10-CM | POA: Diagnosis not present

## 2024-03-31 DIAGNOSIS — H353113 Nonexudative age-related macular degeneration, right eye, advanced atrophic without subfoveal involvement: Secondary | ICD-10-CM | POA: Diagnosis not present

## 2024-03-31 DIAGNOSIS — H43823 Vitreomacular adhesion, bilateral: Secondary | ICD-10-CM | POA: Diagnosis not present

## 2024-03-31 DIAGNOSIS — H353122 Nonexudative age-related macular degeneration, left eye, intermediate dry stage: Secondary | ICD-10-CM | POA: Diagnosis not present

## 2024-03-31 DIAGNOSIS — H35351 Cystoid macular degeneration, right eye: Secondary | ICD-10-CM | POA: Diagnosis not present

## 2024-03-31 DIAGNOSIS — H35071 Retinal telangiectasis, right eye: Secondary | ICD-10-CM | POA: Diagnosis not present
